# Patient Record
Sex: Male | Born: 1935 | Race: White | Hispanic: No | Marital: Married | Smoking: Former smoker
Health system: Southern US, Community
[De-identification: ages and names within clinical notes are randomized; demographics above are authoritative.]

## PROBLEM LIST (undated history)

## (undated) DIAGNOSIS — E860 Dehydration: Secondary | ICD-10-CM

## (undated) DIAGNOSIS — G629 Polyneuropathy, unspecified: Secondary | ICD-10-CM

## (undated) DIAGNOSIS — D649 Anemia, unspecified: Secondary | ICD-10-CM

## (undated) DIAGNOSIS — G709 Myoneural disorder, unspecified: Secondary | ICD-10-CM

## (undated) DIAGNOSIS — I2510 Atherosclerotic heart disease of native coronary artery without angina pectoris: Secondary | ICD-10-CM

## (undated) DIAGNOSIS — L03115 Cellulitis of right lower limb: Secondary | ICD-10-CM

## (undated) DIAGNOSIS — I10 Essential (primary) hypertension: Secondary | ICD-10-CM

## (undated) DIAGNOSIS — G8929 Other chronic pain: Secondary | ICD-10-CM

## (undated) DIAGNOSIS — F32A Depression, unspecified: Secondary | ICD-10-CM

## (undated) DIAGNOSIS — I770 Arteriovenous fistula, acquired: Secondary | ICD-10-CM

## (undated) DIAGNOSIS — I639 Cerebral infarction, unspecified: Secondary | ICD-10-CM

## (undated) DIAGNOSIS — R6251 Failure to thrive (child): Secondary | ICD-10-CM

## (undated) DIAGNOSIS — R159 Full incontinence of feces: Secondary | ICD-10-CM

## (undated) DIAGNOSIS — C801 Malignant (primary) neoplasm, unspecified: Secondary | ICD-10-CM

## (undated) DIAGNOSIS — N184 Chronic kidney disease, stage 4 (severe): Secondary | ICD-10-CM

## (undated) DIAGNOSIS — H9190 Unspecified hearing loss, unspecified ear: Secondary | ICD-10-CM

## (undated) DIAGNOSIS — F329 Major depressive disorder, single episode, unspecified: Secondary | ICD-10-CM

## (undated) DIAGNOSIS — I209 Angina pectoris, unspecified: Secondary | ICD-10-CM

## (undated) DIAGNOSIS — I499 Cardiac arrhythmia, unspecified: Secondary | ICD-10-CM

## (undated) DIAGNOSIS — R319 Hematuria, unspecified: Secondary | ICD-10-CM

## (undated) DIAGNOSIS — N400 Enlarged prostate without lower urinary tract symptoms: Secondary | ICD-10-CM

## (undated) DIAGNOSIS — R634 Abnormal weight loss: Secondary | ICD-10-CM

## (undated) DIAGNOSIS — Z8673 Personal history of transient ischemic attack (TIA), and cerebral infarction without residual deficits: Secondary | ICD-10-CM

## (undated) DIAGNOSIS — R32 Unspecified urinary incontinence: Secondary | ICD-10-CM

## (undated) DIAGNOSIS — N2581 Secondary hyperparathyroidism of renal origin: Secondary | ICD-10-CM

## (undated) DIAGNOSIS — R600 Localized edema: Secondary | ICD-10-CM

## (undated) HISTORY — DX: Failure to thrive (child): R62.51

## (undated) HISTORY — DX: Dehydration: E86.0

## (undated) HISTORY — PX: CORONARY ARTERY BYPASS GRAFT: SHX141

## (undated) HISTORY — PX: TONSILLECTOMY: SUR1361

## (undated) HISTORY — PX: OTHER SURGICAL HISTORY: SHX169

## (undated) HISTORY — DX: Chronic kidney disease, stage 4 (severe): N18.4

## (undated) HISTORY — DX: Anemia, unspecified: D64.9

## (undated) HISTORY — PX: DG AV DIALYSIS  SHUNT ACCESS EXIST*L* OR: HXRAD910

## (undated) HISTORY — PX: HERNIA REPAIR: SHX51

## (undated) HISTORY — DX: Abnormal weight loss: R63.4

## (undated) HISTORY — PX: TOTAL HIP ARTHROPLASTY: SHX124

## (undated) HISTORY — DX: Personal history of transient ischemic attack (TIA), and cerebral infarction without residual deficits: Z86.73

## (undated) HISTORY — PX: TRANSURETHRAL RESECTION OF PROSTATE: SHX73

## (undated) HISTORY — PX: EYE SURGERY: SHX253

---

## 1991-11-30 ENCOUNTER — Encounter: Payer: Self-pay | Admitting: Gastroenterology

## 1991-12-01 ENCOUNTER — Encounter: Payer: Self-pay | Admitting: Gastroenterology

## 1992-12-05 ENCOUNTER — Encounter: Payer: Self-pay | Admitting: Gastroenterology

## 1992-12-06 ENCOUNTER — Encounter: Payer: Self-pay | Admitting: Gastroenterology

## 1994-08-22 ENCOUNTER — Encounter: Payer: Self-pay | Admitting: Gastroenterology

## 1997-09-21 ENCOUNTER — Encounter: Payer: Self-pay | Admitting: Gastroenterology

## 2000-10-21 DIAGNOSIS — C801 Malignant (primary) neoplasm, unspecified: Secondary | ICD-10-CM

## 2000-10-21 HISTORY — DX: Malignant (primary) neoplasm, unspecified: C80.1

## 2000-11-04 ENCOUNTER — Encounter: Payer: Self-pay | Admitting: Gastroenterology

## 2000-11-04 ENCOUNTER — Encounter (INDEPENDENT_AMBULATORY_CARE_PROVIDER_SITE_OTHER): Payer: Self-pay

## 2000-11-04 ENCOUNTER — Other Ambulatory Visit: Admission: RE | Admit: 2000-11-04 | Discharge: 2000-11-04 | Payer: Self-pay | Admitting: Gastroenterology

## 2000-12-19 ENCOUNTER — Encounter: Admission: RE | Admit: 2000-12-19 | Discharge: 2000-12-19 | Payer: Self-pay | Admitting: Nephrology

## 2000-12-19 ENCOUNTER — Encounter: Payer: Self-pay | Admitting: Nephrology

## 2000-12-31 ENCOUNTER — Ambulatory Visit (HOSPITAL_COMMUNITY): Admission: RE | Admit: 2000-12-31 | Discharge: 2000-12-31 | Payer: Self-pay | Admitting: Nephrology

## 2000-12-31 ENCOUNTER — Encounter: Payer: Self-pay | Admitting: Nephrology

## 2002-03-22 ENCOUNTER — Encounter: Payer: Self-pay | Admitting: Gastroenterology

## 2003-11-29 ENCOUNTER — Encounter: Admission: RE | Admit: 2003-11-29 | Discharge: 2003-11-29 | Payer: Self-pay | Admitting: Surgery

## 2003-12-02 ENCOUNTER — Ambulatory Visit (HOSPITAL_COMMUNITY): Admission: RE | Admit: 2003-12-02 | Discharge: 2003-12-02 | Payer: Self-pay | Admitting: Surgery

## 2003-12-02 ENCOUNTER — Ambulatory Visit (HOSPITAL_BASED_OUTPATIENT_CLINIC_OR_DEPARTMENT_OTHER): Admission: RE | Admit: 2003-12-02 | Discharge: 2003-12-03 | Payer: Self-pay | Admitting: Surgery

## 2004-07-10 ENCOUNTER — Ambulatory Visit (HOSPITAL_COMMUNITY): Admission: RE | Admit: 2004-07-10 | Discharge: 2004-07-10 | Payer: Self-pay | Admitting: Anesthesiology

## 2004-08-23 ENCOUNTER — Ambulatory Visit: Payer: Self-pay | Admitting: Gastroenterology

## 2004-09-07 ENCOUNTER — Ambulatory Visit: Payer: Self-pay | Admitting: Gastroenterology

## 2006-07-08 ENCOUNTER — Encounter: Payer: Self-pay | Admitting: Vascular Surgery

## 2006-07-08 ENCOUNTER — Ambulatory Visit (HOSPITAL_COMMUNITY): Admission: RE | Admit: 2006-07-08 | Discharge: 2006-07-08 | Payer: Self-pay | Admitting: Nephrology

## 2008-04-04 ENCOUNTER — Ambulatory Visit: Payer: Self-pay

## 2008-04-13 ENCOUNTER — Emergency Department (HOSPITAL_COMMUNITY): Admission: EM | Admit: 2008-04-13 | Discharge: 2008-04-13 | Payer: Self-pay | Admitting: Emergency Medicine

## 2008-04-25 ENCOUNTER — Encounter: Admission: RE | Admit: 2008-04-25 | Discharge: 2008-04-25 | Payer: Self-pay | Admitting: Neurology

## 2008-05-15 ENCOUNTER — Emergency Department (HOSPITAL_BASED_OUTPATIENT_CLINIC_OR_DEPARTMENT_OTHER): Admission: EM | Admit: 2008-05-15 | Discharge: 2008-05-15 | Payer: Self-pay | Admitting: Emergency Medicine

## 2008-10-25 IMAGING — CR DG CHEST 1V PORT
1 series · 1 of 1 positions shown · non-contrast
Comparison: 11/29/2003.

CLINICAL DATA: Right chest pain following a fall 3 days ago.

PORTABLE CHEST - 1 VIEW

[view not recorded]
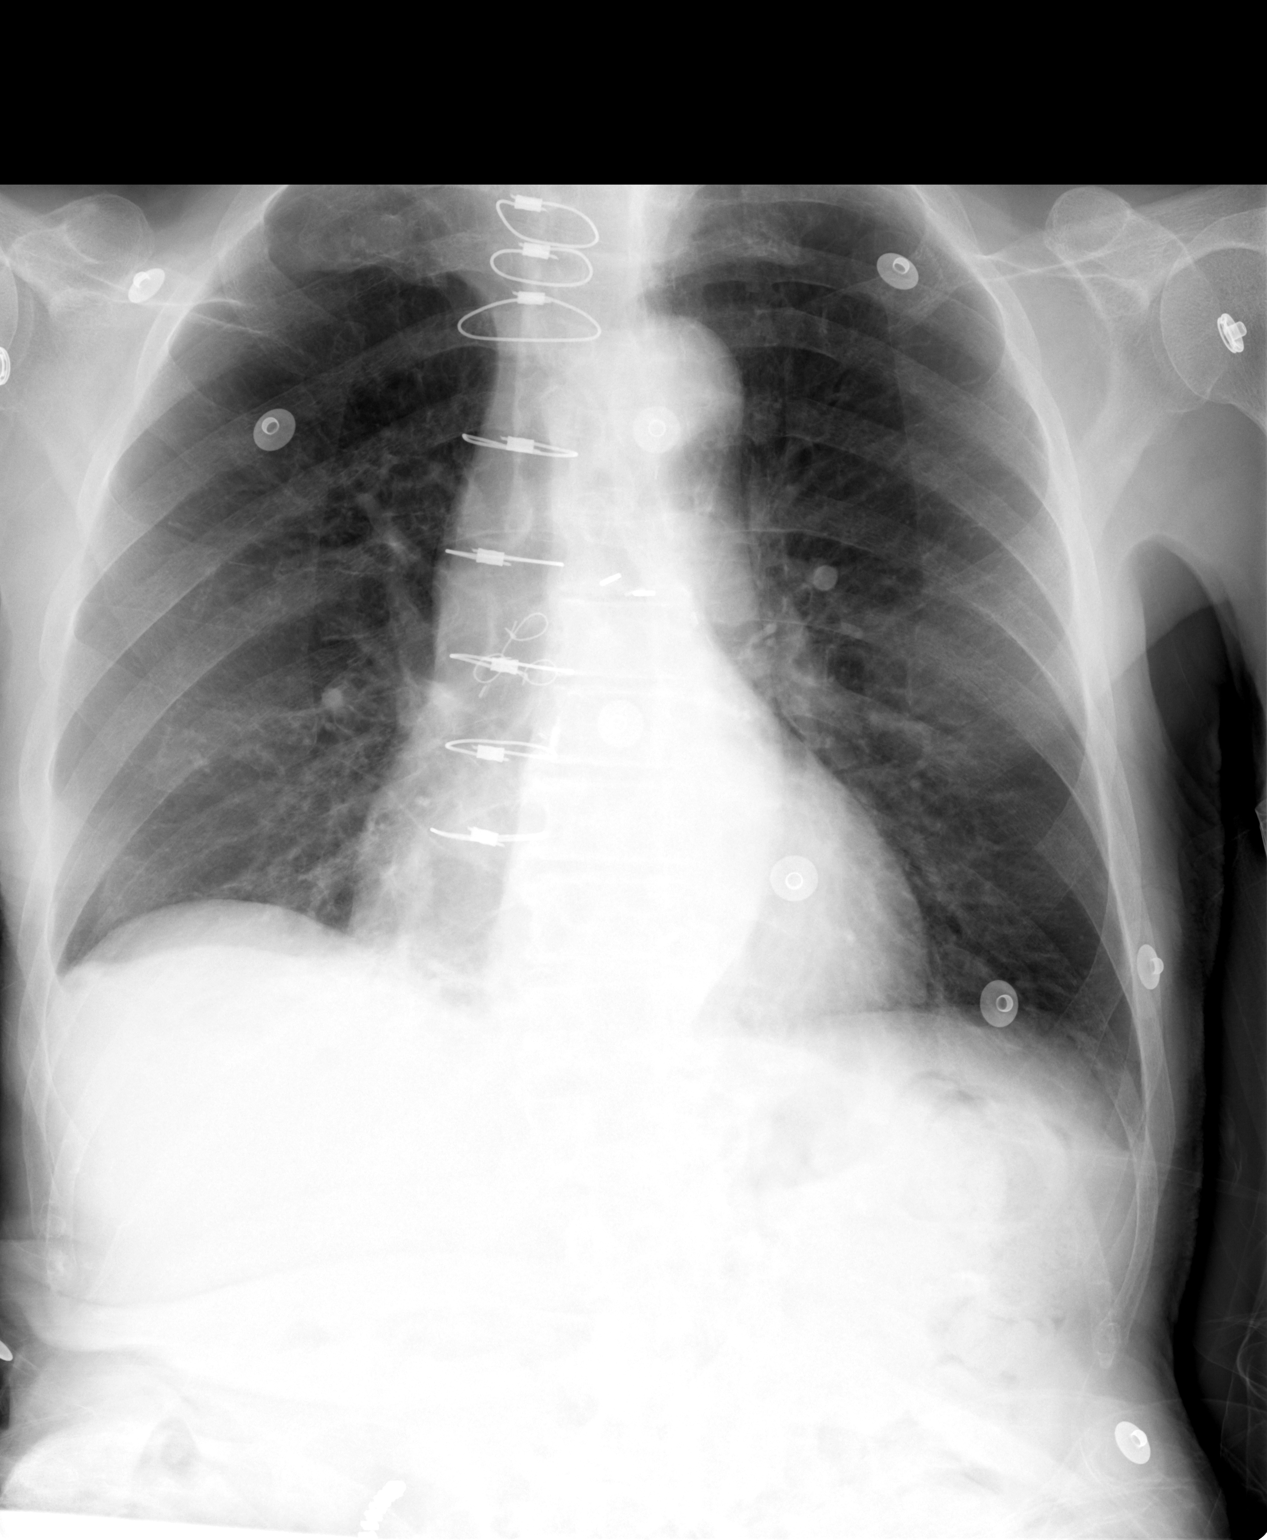

[1 of 1 positions shown; findings below may reference images not displayed]

FINDINGS: Borderline enlarged cardiac silhouette.  Tortuous aorta.
Post CABG changes.  Stable changes of COPD.  Interval small amount
of right pleural thickening or fluid.  No rib fracture or
pneumothorax seen.  Mild scoliosis.
IMPRESSION: 1.  Interval small amount of right pleural thickening or fluid.  If
there is a clinical concern for the possibility of a rib fracture,
right rib views would be recommended.
2.  Interval borderline cardiomegaly.
3.  Stable changes of COPD.

## 2009-04-14 ENCOUNTER — Encounter: Admission: RE | Admit: 2009-04-14 | Discharge: 2009-05-29 | Payer: Self-pay | Admitting: Family Medicine

## 2009-07-28 ENCOUNTER — Encounter (INDEPENDENT_AMBULATORY_CARE_PROVIDER_SITE_OTHER): Payer: Self-pay | Admitting: *Deleted

## 2009-09-06 ENCOUNTER — Ambulatory Visit: Payer: Self-pay | Admitting: Gastroenterology

## 2009-09-06 DIAGNOSIS — R159 Full incontinence of feces: Secondary | ICD-10-CM | POA: Insufficient documentation

## 2009-09-06 DIAGNOSIS — Z860100 Personal history of colon polyps, unspecified: Secondary | ICD-10-CM | POA: Insufficient documentation

## 2010-01-19 ENCOUNTER — Encounter: Payer: Self-pay | Admitting: Cardiovascular Disease

## 2010-01-19 DIAGNOSIS — I701 Atherosclerosis of renal artery: Secondary | ICD-10-CM

## 2010-01-22 ENCOUNTER — Ambulatory Visit: Payer: Self-pay

## 2010-01-22 ENCOUNTER — Encounter: Payer: Self-pay | Admitting: Cardiovascular Disease

## 2010-11-01 ENCOUNTER — Ambulatory Visit: Admit: 2010-11-01 | Payer: Self-pay | Admitting: Vascular Surgery

## 2010-11-01 ENCOUNTER — Ambulatory Visit
Admission: RE | Admit: 2010-11-01 | Discharge: 2010-11-01 | Payer: Self-pay | Source: Home / Self Care | Attending: Vascular Surgery | Admitting: Vascular Surgery

## 2010-11-10 NOTE — Procedures (Unsigned)
CEPHALIC VEIN MAPPING  INDICATION:  Preop for AV fistula placement per [facility] standing order.  HISTORY: Stage IV chronic kidney disease.  EXAM: The right cephalic vein is compressible.  Diameter measurements range from 0.2 to 0.27 cm.  The right basilic vein is compressible.  Diameter measurements range from 0.2 to 0.45 cm.  The left cephalic vein is compressible.  Diameter measurements range from 0.15 to 0.41 cm.  The left basilic vein is compressible.  Diameter measurements range from 0.2 to 0.8 cm.  See attached worksheet for all measurements.  IMPRESSION:  Patent bilateral cephalic vein and basilic veins with diameter measurements as described above.  ___________________________________________ Janetta Hora. Fields, MD  CH/MEDQ  D:  11/01/2010  T:  11/01/2010  Job:  [id number]

## 2010-11-12 LAB — BASIC METABOLIC PANEL
Calcium: 9.4 mg/dL (ref 8.4–10.5)
GFR calc Af Amer: 21 mL/min — ABNORMAL LOW (ref >60)
GFR calc non Af Amer: 18 mL/min — ABNORMAL LOW (ref >60)
Potassium: 4.2 mEq/L (ref 3.5–5.1)
Sodium: 142 mEq/L (ref 135–145)

## 2010-11-12 LAB — CBC
Hemoglobin: 12.6 g/dL — ABNORMAL LOW (ref 13.0–17.0)
MCHC: 31.9 g/dL (ref 30.0–36.0)
RDW: 13.1 % (ref 11.5–15.5)
WBC: 6.1 10*3/uL (ref 4.0–10.5)

## 2010-11-12 LAB — SURGICAL PCR SCREEN
MRSA, PCR: NEGATIVE
Staphylococcus aureus: NEGATIVE

## 2010-11-13 ENCOUNTER — Ambulatory Visit (HOSPITAL_COMMUNITY)
Admission: RE | Admit: 2010-11-13 | Discharge: 2010-11-13 | Payer: Self-pay | Source: Home / Self Care | Attending: Vascular Surgery | Admitting: Vascular Surgery

## 2010-11-14 LAB — POCT I-STAT 4, (NA,K, GLUC, HGB,HCT): Sodium: 141 mEq/L (ref 135–145)

## 2010-11-22 NOTE — Miscellaneous (Signed)
Summary: Orders Update  Clinical Lists Changes  Problems: Added new problem of ATHEROSCLEROSIS, RENAL ARTERY (ICD-440.1) Orders: Added new Test order of Renal Artery Duplex (Renal Artery Duplex) - Signed 

## 2010-11-23 NOTE — Op Note (Signed)
  Benjamin Macdonald, Benjamin Macdonald              ACCOUNT NO.:  [id number]  MEDICAL RECORD NO.:  [id number]           PATIENT TYPE:  LOCATION:                                 FACILITY:  PHYSICIAN:  Aunna Snooks E. Dorotha Hirschi, MD  DATE OF BIRTH:  [birth year]  DATE OF PROCEDURE: DATE OF DISCHARGE:                              OPERATIVE REPORT   PROCEDURE:  Left brachiocephalic arteriovenous fistula.  PREOPERATIVE DIAGNOSIS:  End-stage renal disease.  POSTOPERATIVE DIAGNOSIS:  End-stage renal disease.  ANESTHESIA:  Local with IV sedation.  SURGEON:  Janetta Hora. Daegan Arizmendi, MD  ASSISTANT:  Pecola Leisure, PA-C  OPERATIVE FINDINGS:  2.5-3 mm left cephalic vein.  OPERATIVE DETAILS:  After obtaining informed consent, the patient was taken to the operating room.  The patient was placed in supine position on the operating table.  After adequate sedation, the patient's entire left upper extremity was prepped and draped in the usual sterile fashion.  Local anesthesia was infiltrated near the antecubital crease. A transverse incision was made in this location and carried down through the subcutaneous tissues down the level of the cephalic vein.  Cephalic vein was dissected free circumferentially.  Small side branches were ligated and divided between silk ties.  The vein was of good quality approximately 2.5-3 mm in diameter.  Next, the brachial artery was dissected free in the medial portion of the incision.  The patient was then given 5000 units of intravenous heparin.  Vessel loops were used to control the artery proximally and distally.  A longitudinal opening was made in the brachial artery and the vein was sewn end of vein to side of artery using a running 7-0 Prolene suture.  Just prior to completion of anastomosis, this was forebled, backbled, and thoroughly flushed. Anastomosis was secured.  Vessel loops were released.  There was palpable thrill in the fistula immediately.  The patient had good audible  Doppler flow in the radial and ulnar arteries after opening the fistula.  This augmented slightly with compression of the fistula.  Next, hemostasis was obtained.  Subcutaneous tissues were reapproximated using running 3-0 Vicryl suture.  Skin was closed with 4-0 Vicryl subcuticular stitch.  The patient tolerated the procedure well and there were no complications.  Instrument, sponge, and needle counts were correct at the end of the case.  The patient was taken to the recovery room in stable condition.     Janetta Hora. Azeez Dunker, MD     CEF/MEDQ  D:  11/13/2010  T:  11/14/2010  Job:  [id number]  Electronically Signed by Fabienne Bruns MD on 11/23/2010 01:09:07 PM

## 2010-11-29 ENCOUNTER — Ambulatory Visit (INDEPENDENT_AMBULATORY_CARE_PROVIDER_SITE_OTHER): Payer: Medicare Other

## 2010-11-29 DIAGNOSIS — N186 End stage renal disease: Secondary | ICD-10-CM

## 2010-11-30 NOTE — Assessment & Plan Note (Signed)
OFFICE VISIT  [name], [name] DOB:  07-29-1921                                       11/29/2010 [id number]#:[id number]  HISTORY OF PRESENT ILLNESS:  Patient is an 77 year old gentleman with end-stage renal disease who had a left brachial arterial venous fistula placed on 11/13/2010.  He is doing well.  He complains of some achiness in the forearm but no numbness, tingling, or pain in his hand.  He can use his hand normally at this time.  This achiness does not keep him awake at night and is on the medial aspect of the anterior forearm.  PHYSICAL EXAMINATION:  This is a well-developed, well-nourished gentleman in no acute distress.  He has a palpable radial pulse.  He has a good thrill and bruit in the left upper arm AV fistula.  He is slightly tender to touch in the medial aspect of the anterior forearm, but this is very mild in nature.  He has good sensation and good motion and an excellent grip in the left hand with a palpable radial pulse.  ASSESSMENT/PLAN:  Status post left upper arm arteriovenous fistula with good thrill and bruit and mild discomfort in the medial forearm but no overt signs of steal.  PLAN:  The patient and family were advised that if the pain in his forearm did not improve with exercising or should become worse, to return to clinic for possible ligation of the fistula.  They were also given other signs and symptoms of steal, and if these were to become an issue, then he will return to clinic as well.  Della Goo, PA-C  Charles E. Fields, MD Electronically Signed  RR/MEDQ  D:  11/29/2010  T:  11/29/2010  Job:  [id number]

## 2011-02-14 ENCOUNTER — Ambulatory Visit (INDEPENDENT_AMBULATORY_CARE_PROVIDER_SITE_OTHER): Payer: Medicare Other | Admitting: Vascular Surgery

## 2011-02-14 ENCOUNTER — Encounter (INDEPENDENT_AMBULATORY_CARE_PROVIDER_SITE_OTHER): Payer: Medicare Other

## 2011-02-14 DIAGNOSIS — T82598A Other mechanical complication of other cardiac and vascular devices and implants, initial encounter: Secondary | ICD-10-CM

## 2011-02-14 DIAGNOSIS — N186 End stage renal disease: Secondary | ICD-10-CM

## 2011-02-15 NOTE — Assessment & Plan Note (Signed)
OFFICE VISIT  Benjamin Macdonald, Benjamin Macdonald DOB:  09-Jul-1921                                       02/14/2011 [id number]#:[id number]  The patient returns for followup today.  He previously had a left brachiocephalic AV fistula placed in January of 2012.  He returns today for further followup and evaluation of his fistula.  He denies any symptoms of numbness, tingling or aching in his hand.  He is currently not on dialysis.  REVIEW OF SYSTEMS:  He denies any shortness of breath or chest pain.  PHYSICAL EXAM:  Vital signs:  Blood pressure is 146/69 in the right arm, heart rate is 74 and regular, respirations 16.  Left upper extremity has an easily palpable thrill in the left upper arm AV fistula.  There is some tortuosity to the fistula but overall it appears well-developed.  He had a duplex ultrasound of the fistula today which shows that the diameter is fairly uniform between 60 and 90 mm.  There is one side branch in the proximal third of the fistula.  At this point the patient currently is not on dialysis.  If he requires dialysis at some point in the future I think the fistula should be usable.  If not he will return for further followup.  Otherwise he will follow up on an as-needed basis.    Janetta Hora. Jadia Capers, MD Electronically Signed  CEF/MEDQ  D:  02/14/2011  T:  02/15/2011  Job:  [id number]  cc:   Duke Salvia. Eliott Nine, M.D.

## 2011-02-22 NOTE — Procedures (Unsigned)
VASCULAR LAB EXAM  INDICATION:  HISTORY:  EXAM:  Duplex of the left upper extremity fistula shows elevated velocities at the anastomosis and a narrowed segment in the mid upper arm.  There is a large branch observed in the proximal to mid upper arm. The ulnar and radial arteries are antegrade in the forearm.  Please see worksheet for details.  IMPRESSION:  Patent left upper extremity fistula, as described above.  INDICATIONS:  Follow-up left upper arm fistula.  ___________________________________________ Janetta Hora. Fields, MD  LT/MEDQ  D:  02/14/2011  T:  02/14/2011  Job:  [id number]

## 2011-03-05 NOTE — Assessment & Plan Note (Signed)
OFFICE VISIT   KEEFER, SOULLIERE  DOB:  1921-07-11                                       11/01/2010  EAVWU#:[id number]   CHIEF COMPLAINT:  Needs hemodialysis access.   HISTORY OF PRESENT ILLNESS:  The patient is an 77 year old male referred  by Dr. Eliott Nine for evaluation and placement of a long-term hemodialysis  access.  The patient currently is not on dialysis.  Most recent serum  creatinine was 2.6 on 08/08/2010.   The patient denies any symptoms currently of volume overload or uremia  such as weight loss, nausea, vomiting, skin itching or shortness of  breath.  He is right-handed.   CHRONIC MEDICAL PROBLEMS:  Include hypertension which is followed by Dr.  Eliott Nine.   PAST SURGICAL HISTORY:  Hemorrhoidectomy, inguinal hernia repair  bilaterally, coronary artery bypass grafting, right total hip  replacement.   SOCIAL HISTORY:  He is married.  He has 3 children.  His daughter works  for [employer].  He is a former smoker, quit 35 years ago.  He does  not consume alcohol regularly.   FAMILY HISTORY:  His mother had vascular disease at age less than 96.   REVIEW OF SYSTEMS:  Full 12 point review of systems was performed with  the patient today.  VASCULAR:  He has a prior history of TIA.  GENERAL:  He is 5 feet 4 inches, 140 pounds.  HEMATOLOGIC:  History of anemia.  HEENT:  Some decrease in hearing.  All other systems are negative.   MEDICATIONS:  1. Ticlid 250 mg twice a day.  2. Nifedipine 120 mg once a day.  3. Hydrochlorothiazide 25 mg once a day.  4. Zocor 40 mg once a day.  5. Theragran once a day.  6. Methadone 5 mg three times a day.  7. Calcium, vitamin C once daily.  8. Neurontin 100 mg three times a day.  9. Lidoderm patch every 12 hours.  10.Furosemide 60 mg once a day.   ALLERGIES:  He has allergies listed to aspirin, Naprosyn and  nonsteroidals.   PHYSICAL EXAM:  Vital signs:  Blood pressure is 157/78 in the left arm,  heart rate 88 and regular.  HEENT:  Unremarkable.  Neck:  Has 2+ carotid  pulses without bruit.  Chest:  Clear to auscultation.  Cardiac:  Regular  rate and rhythm without murmur.  Abdomen:  Thin, soft, nontender,  nondistended.  Extremities:  No obvious major musculoskeletal  deformities.  Neurologic:  Shows symmetric upper extremity and lower  extremity motor strength which is 4/5.  Skin:  Has no open ulcers or  rashes.  Vascular:  Exam shows 2+ brachial and radial pulse bilaterally  in the upper extremity.   He had a vein mapping ultrasound today which shows the cephalic vein in  the left upper arm and the basilic vein in the right upper arm would be  of reasonable caliber to consider placing a fistula.  Physical exam  correlates with this.   I believe the best option for the patient at this point would be  placement of a left brachiocephalic AV fistula.  Risks, benefits,  possible complications and procedure details including but not limited  to nonmaturation of the fistula, bleeding, infection, ischemic steal  were explained to the patient and his wife and daughter today.  He  understands  and agrees to proceed.  His procedure is scheduled for  11/13/2010.     Janetta Hora. Fields, MD  Electronically Signed   CEF/MEDQ  D:  11/01/2010  T:  11/02/2010  Job:  [id number]   cc:   Duke Salvia. Eliott Nine, M.D.

## 2011-03-08 NOTE — Op Note (Signed)
NAME:  Benjamin Macdonald, Benjamin Macdonald                        ACCOUNT NO.:  [id number]   MEDICAL RECORD NO.:  [id number]                   PATIENT TYPE:  AMB   LOCATION:  [facility]                                  FACILITY:  [facility]   PHYSICIAN:  Currie Paris, M.D.           DATE OF BIRTH:  1921/07/14   DATE OF PROCEDURE:  12/02/2003  DATE OF DISCHARGE:                                 OPERATIVE REPORT   PREOPERATIVE DIAGNOSIS:  Right inguinal hernia, recurrent.   POSTOPERATIVE DIAGNOSIS:  Right inguinal hernia, recurrent.   OPERATION:  Repair of right inguinal hernia.   SURGEON:  Currie Paris, M.D.   ANESTHESIA:  MAC.   CLINICAL HISTORY:  This patient is an elderly gentleman who presented with a  recurrent right inguinal hernia, recently developed.  He had what he called  a [place] repair done in 1989.  The bulge is noticeable when he is up a  fair amount, and he has some discomfort in it and into the right testicle,  but the hernia reduced.  On exam, it seemed to be somewhat medially located  in my note, but I think that meant laterally since I had said that it as a  recurrence at the deep ring.  On exam today, it appeared to be fairly  lateral.   DESCRIPTION OF THE PROCEDURE:  The patient was seen in the holding area, and  the right inguinal area was identified by the patient and myself as the  operative site.   He was taken to the operating room and given IV sedation.  The groin area  was clipped, prepped, and draped.  1% Xylocaine plain was mixed equally with  0.5% plain Marcaine and used for local.  The area of the incision was  infiltrated, as well as below the anterior-superior iliac spine.  An  incision was made deep into the external oblique aponeurosis, which was  cleaned off.  There was a lot of scarring here from his prior surgery, and I  could see the superficial ring.  I was able to open the superficial ring  through the scar tissue, going laterally until I got into a  normal-looking  external oblique aponeurosis.  This was elevated off the underlying tissues.  A lot of the cord structures were stuck to the underlying external oblique.  It had to be dissected off those, but that went nicely.  The cord basically  just appeared to be the vessels and the vas, and I was able to identify  this.  Medially, the sutures from the old repair were noted, and I was then  able to sharply dissect the cord structures off the underlying repair, and  the repair appeared intact, and basically, he had a markedly dilated deep  ring, which was about a fingerbreadth in size, and this is where the hernia  was, and basically, it was lateral to the cord.   I freed up the  external oblique for a good distance all around so that I  could see the internal oblique and the repair and got the cord well up off  the floor.  I then took a small mesh plug and placed it into the deep ring  lateral to the cord and secured the deep ring laterally with two sutures of  Prolene, incorporating the mesh plug.  I then took a large mesh patch and  laid it on the floor to reinforce the floor again.  I was particularly  worried medially, but I was worried laterally and that we needed to get just  good reinforcement out here, so this was placed in, and I sutured it in  place with a 2-0 Prolene running, both from the tubercle going laterally  along the edge of the external oblique reflection and then medially well up  onto the deep ring so that we had good closure and good coverage.  The cord  appeared to be intact and not under any compression.   The incision was then closed, closing the external oblique with 3-0 Vicryl,  the Scarpa's with 3-0 Vicryl, and the skin with a 4-0 Monocryl subcuticular  plus Dermabond.  The patient tolerated the procedure well.  There were no  operative complications, and all counts were correct.                                               Currie Paris,  M.D.    CJS/MEDQ  D:  12/02/2003  T:  12/02/2003  Job:  [id number]   cc:   Dellis Anes. Idell Pickles, M.D.  [address]  [place]  [place] [address]  Fax: [phone]

## 2011-12-05 ENCOUNTER — Encounter: Payer: Self-pay | Admitting: Cardiology

## 2011-12-06 ENCOUNTER — Encounter: Payer: Self-pay | Admitting: Cardiology

## 2011-12-06 ENCOUNTER — Encounter (HOSPITAL_COMMUNITY): Payer: Self-pay | Admitting: Emergency Medicine

## 2011-12-06 ENCOUNTER — Inpatient Hospital Stay (HOSPITAL_COMMUNITY)
Admission: EM | Admit: 2011-12-06 | Discharge: 2011-12-07 | DRG: 683 | Disposition: A | Discharge status: Home / Self Care | Payer: Medicare Other | Attending: Internal Medicine | Admitting: Internal Medicine

## 2011-12-06 ENCOUNTER — Other Ambulatory Visit: Payer: Self-pay

## 2011-12-06 ENCOUNTER — Emergency Department (HOSPITAL_COMMUNITY): Payer: Medicare Other

## 2011-12-06 DIAGNOSIS — Z79899 Other long term (current) drug therapy: Secondary | ICD-10-CM

## 2011-12-06 DIAGNOSIS — N179 Acute kidney failure, unspecified: Principal | ICD-10-CM | POA: Diagnosis present

## 2011-12-06 DIAGNOSIS — Z880 Allergy status to penicillin: Secondary | ICD-10-CM

## 2011-12-06 DIAGNOSIS — Z886 Allergy status to analgesic agent status: Secondary | ICD-10-CM

## 2011-12-06 DIAGNOSIS — N19 Unspecified kidney failure: Secondary | ICD-10-CM

## 2011-12-06 DIAGNOSIS — Z951 Presence of aortocoronary bypass graft: Secondary | ICD-10-CM

## 2011-12-06 DIAGNOSIS — R634 Abnormal weight loss: Secondary | ICD-10-CM | POA: Diagnosis present

## 2011-12-06 DIAGNOSIS — I120 Hypertensive chronic kidney disease with stage 5 chronic kidney disease or end stage renal disease: Secondary | ICD-10-CM | POA: Diagnosis present

## 2011-12-06 DIAGNOSIS — N185 Chronic kidney disease, stage 5: Secondary | ICD-10-CM | POA: Diagnosis present

## 2011-12-06 DIAGNOSIS — N184 Chronic kidney disease, stage 4 (severe): Secondary | ICD-10-CM | POA: Diagnosis present

## 2011-12-06 DIAGNOSIS — R5381 Other malaise: Secondary | ICD-10-CM | POA: Diagnosis present

## 2011-12-06 DIAGNOSIS — R627 Adult failure to thrive: Secondary | ICD-10-CM | POA: Diagnosis present

## 2011-12-06 DIAGNOSIS — Z8546 Personal history of malignant neoplasm of prostate: Secondary | ICD-10-CM

## 2011-12-06 DIAGNOSIS — E861 Hypovolemia: Secondary | ICD-10-CM | POA: Diagnosis present

## 2011-12-06 DIAGNOSIS — G589 Mononeuropathy, unspecified: Secondary | ICD-10-CM | POA: Diagnosis present

## 2011-12-06 DIAGNOSIS — E860 Dehydration: Secondary | ICD-10-CM | POA: Diagnosis present

## 2011-12-06 DIAGNOSIS — I2510 Atherosclerotic heart disease of native coronary artery without angina pectoris: Secondary | ICD-10-CM | POA: Diagnosis present

## 2011-12-06 HISTORY — DX: Atherosclerotic heart disease of native coronary artery without angina pectoris: I25.10

## 2011-12-06 HISTORY — DX: Essential (primary) hypertension: I10

## 2011-12-06 HISTORY — DX: Malignant (primary) neoplasm, unspecified: C80.1

## 2011-12-06 HISTORY — DX: Polyneuropathy, unspecified: G62.9

## 2011-12-06 LAB — COMPREHENSIVE METABOLIC PANEL
Albumin: 3.5 g/dL (ref 3.5–5.2)
BUN: 99 mg/dL — ABNORMAL HIGH (ref 6–23)
Calcium: 9.9 mg/dL (ref 8.4–10.5)
Chloride: 91 mEq/L — ABNORMAL LOW (ref 96–112)
Creatinine, Ser: 3.87 mg/dL — ABNORMAL HIGH (ref 0.50–1.35)
Total Bilirubin: 0.4 mg/dL (ref 0.3–1.2)

## 2011-12-06 LAB — DIFFERENTIAL
Basophils Relative: 0 % (ref 0–1)
Eosinophils Absolute: 0 10*3/uL (ref 0.0–0.7)
Eosinophils Relative: 0 % (ref 0–5)
Monocytes Absolute: 0.6 10*3/uL (ref 0.1–1.0)
Monocytes Relative: 8 % (ref 3–12)
Neutro Abs: 5.5 10*3/uL (ref 1.7–7.7)

## 2011-12-06 LAB — CBC
HCT: 38.2 % — ABNORMAL LOW (ref 39.0–52.0)
Hemoglobin: 13.1 g/dL (ref 13.0–17.0)
MCH: 30.3 pg (ref 26.0–34.0)
MCHC: 34.3 g/dL (ref 30.0–36.0)
MCV: 88.4 fL (ref 78.0–100.0)

## 2011-12-06 LAB — URINALYSIS, ROUTINE W REFLEX MICROSCOPIC
Glucose, UA: NEGATIVE mg/dL
Leukocytes, UA: NEGATIVE
Protein, ur: NEGATIVE mg/dL
Urobilinogen, UA: 0.2 mg/dL (ref 0.0–1.0)

## 2011-12-06 MED ORDER — SODIUM CHLORIDE 0.9 % IV SOLN
INTRAVENOUS | Status: DC
  Administered 2011-12-06: 20:00:00 via INTRAVENOUS

## 2011-12-06 NOTE — ED Provider Notes (Signed)
History     CSN: [id number]  Arrival date & time 12/06/11  1423   First MD Initiated Contact with Patient 12/06/11 1631      Chief Complaint  Patient presents with  . Weakness    (Consider location/radiation/quality/duration/timing/severity/associated sxs/prior treatment) Patient is a 78 y.o. male presenting with weakness. The history is provided by the patient (Patient complains of weakness or 3 weeks also not eating much. His daughter states that they call Dr. Lowell Guitar today in Dr. Lowell Guitar told him to bring the patient to the emergency room to be admitted. He seems to be getting worsening renal failure.). No language interpreter was used.  Weakness Primary symptoms do not include headaches, syncope or seizures. The symptoms began more than 1 week ago. The symptoms are unchanged. The neurological symptoms are diffuse. The symptoms occurred on exertion.  Additional symptoms include weakness. Additional symptoms do not include neck stiffness or hallucinations. Medical issues do not include seizures. Workup history does not include EEG.    Past Medical History  Diagnosis Date  . Renal disorder   . Hypertension   . Cancer   . Coronary artery disease     Past Surgical History  Procedure Date  . Coronary artery bypass graft   . Dg av dialysis  shunt access exist*l* or     No family history on file.  History  Substance Use Topics  . Smoking status: Never Smoker   . Smokeless tobacco: Not on file  . Alcohol Use: No      Review of Systems  Constitutional: Positive for fatigue.  HENT: Negative for congestion, neck stiffness, sinus pressure and ear discharge.   Eyes: Negative for discharge.  Respiratory: Negative for cough.   Cardiovascular: Negative for chest pain and syncope.  Gastrointestinal: Negative for abdominal pain and diarrhea.  Genitourinary: Negative for frequency and hematuria.  Musculoskeletal: Negative for back pain.  Skin: Negative for rash.  Neurological:  Positive for weakness. Negative for seizures and headaches.  Hematological: Negative.   Psychiatric/Behavioral: Negative for hallucinations.    Allergies  Amoxicillin; Aspirin; and Nsaids  Home Medications   Current Outpatient Rx  Name Route Sig Dispense Refill  . CALCITRIOL 0.25 MCG PO CAPS Oral Take 0.25 mcg by mouth daily.    Marland Kitchen CALCIUM CARBONATE-VITAMIN D 500-200 MG-UNIT PO TABS Oral Take 1 tablet by mouth daily.    Marland Kitchen CLOPIDOGREL BISULFATE 75 MG PO TABS Oral Take 75 mg by mouth daily.    . FUROSEMIDE 20 MG PO TABS Oral Take 60 mg by mouth daily.    Marland Kitchen GABAPENTIN 100 MG PO CAPS Oral Take 100 mg by mouth 3 (three) times daily.    Marland Kitchen HYDROCHLOROTHIAZIDE 25 MG PO TABS Oral Take 25 mg by mouth daily.    Marland Kitchen METHADONE HCL 5 MG PO TABS Oral Take 5 mg by mouth 4 (four) times daily.    Carma Leaven M PLUS PO TABS Oral Take 1 tablet by mouth daily.    Marland Kitchen NIFEDIPINE 10 MG PO CAPS Oral Take 30 mg by mouth 2 (two) times daily.    Marland Kitchen SIMVASTATIN 40 MG PO TABS Oral Take 40 mg by mouth every evening.      BP 135/77  Pulse 89  Temp(Src) 97.4 F (36.3 C) (Oral)  Resp 14  SpO2 98%  Physical Exam  Constitutional: He is oriented to person, place, and time. He appears well-developed.  HENT:  Head: Normocephalic and atraumatic.  Eyes: Conjunctivae and EOM are normal. No scleral icterus.  Neck: Neck supple. No thyromegaly present.  Cardiovascular: Normal rate and regular rhythm.  Exam reveals no gallop and no friction rub.   No murmur heard. Pulmonary/Chest: No stridor. He has no wheezes. He has no rales. He exhibits no tenderness.  Abdominal: He exhibits no distension. There is no tenderness. There is no rebound.  Musculoskeletal: Normal range of motion. He exhibits no edema.  Lymphadenopathy:    He has no cervical adenopathy.  Neurological: He is oriented to person, place, and time. Coordination normal.  Skin: No rash noted. No erythema.  Psychiatric: He has a normal mood and affect. His behavior is  normal.    ED Course  Procedures (including critical care time)  Labs Reviewed  CBC - Abnormal; Notable for the following:    HCT 38.2 (*)    All other components within normal limits  COMPREHENSIVE METABOLIC PANEL - Abnormal; Notable for the following:    Potassium 3.4 (*)    Chloride 91 (*)    Glucose, Bld 118 (*)    BUN 99 (*)    Creatinine, Ser 3.87 (*)    GFR calc non Af Amer 13 (*)    GFR calc Af Amer 15 (*)    All other components within normal limits  DIFFERENTIAL  URINALYSIS, ROUTINE W REFLEX MICROSCOPIC   Dg Chest 2 View  12/06/2011  *RADIOLOGY REPORT*  Clinical Data: Weakness, weight loss  CHEST - 2 VIEW  Comparison: 11/08/2010  Findings: Cardiomediastinal silhouette is stable.  No acute infiltrate or pulmonary edema.  Status post median sternotomy again noted.  Atherosclerotic calcifications of thoracic aorta again noted.  Thoracic spine osteopenia.  IMPRESSION: No active disease.  No significant change  Original Report Authenticated By: Natasha Mead, M.D.     No diagnosis found.   Date: 12/06/2011  Rate:66  Rhythm: normal sinus rhythm  QRS Axis: normal  Intervals: normal  ST/T Wave abnormalities: nonspecific ST changes  Conduction Disutrbances:right bundle branch block  Narrative Interpretation:   Old EKG Reviewed: unchanged    MDM  Renal failure   I spoke with Dr. Lowell Guitar who wants the patient admitted he feels like the Neurontin has caused some renal failure     Benny Lennert, MD 12/06/11 [phone]

## 2011-12-06 NOTE — ED Notes (Signed)
[id number] Ready

## 2011-12-06 NOTE — ED Notes (Signed)
Pt eating meal tray with family at bedside. Awaiting orders to transfer pt to assigned bed.

## 2011-12-06 NOTE — ED Notes (Signed)
Patient transported to #25 with family and all personal belongings. Patient currently eating dinner. Admitting MD at bedside speaking with patient and family.

## 2011-12-06 NOTE — ED Notes (Signed)
Patient states feeling weak all over x 3 weeks.  Patient states he visited primary MD yesterday and he received call for abnormal results of high creatinine.  Patient with stage 4 kidney disease.  Left sided AV fistula placed in January.

## 2011-12-06 NOTE — Consult Note (Addendum)
HPI: I was asked by Dr. Estell Harpin to see Benjamin Macdonald who is a 79 y.o. 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 Eliott Nine.  Labs done on 12/21 BUN79 cr3.77  Over the past couple of weeks patient has had decreased oral intake and been wobbly.  He has severe painful neuropathy and takes Methadone and Neurontin.  He has had some confusion with the dose of neurontin taking maybe 200mg  QID.The patient has lost 12 pounds over the last couple of weeks.  Patient  Was seen by his primary [facility] MD on 2/14 and labs revealed a BUN of 111 and creatinine of 4.17mg /dl.  The patient was tod to d/c neurontin yesterday.  In the ER he is hungry and eating.  Hbg was 12.6 on 1/19, to 13.1g today  Past Medical History  Diagnosis Date  . Renal disorder   . Hypertension   . Cancer   . Coronary artery disease    Past Surgical History  Procedure Date  . Coronary artery bypass graft   . Dg av dialysis  shunt access exist*l* or    Social History:  reports that he has never smoked. He does not have any smokeless tobacco history on file. He reports that he does not drink alcohol. His drug history not on file. Allergies:  Allergies  Allergen Reactions  . Amoxicillin Swelling  . Aspirin Swelling  . Nsaids Swelling   No family history on file.  Medications: I have reviewed the patient's current medications.  Home meds include Plavix, Nifedipine 1m BID, HCTZ 25mg /day, Zocor 40mg /day, Methadone 5mg  QID, Calcium & Vit D, Neurontin 100mg  QID d/c'd, Lidoderm patch 12hr, Furosemide 60mg /day, calcitriol 0.61mcg qd, MVI  ROS: painful neuropathy Blood pressure 153/64, pulse 78, temperature 97.6 F (36.4 C), temperature source Oral, resp. rate 16, SpO2 96.00%.  General appearance: alert, cooperative and appears stated age Head: Normocephalic, without obvious abnormality, atraumatic Resp: clear to auscultation bilaterally Chest wall: no tenderness Cardio: regular rate and rhythm, S1, S2 normal, no murmur, click, rub or  gallop GI: soft, non-tender; bowel sounds normal; no masses,  no organomegaly Extremities: extremities normal, atraumatic, no cyanosis or edema, left brac-ceph AVF Skin: Skin color, texture, turgor normal. No rashes or lesions Neurologic: Grossly normal  Results for orders placed during the hospital encounter of 12/06/11 (from the past 48 hour(s))  CBC     Status: Abnormal   Collection Time   12/06/11  5:08 PM      Component Value Range Comment   WBC 8.1  4.0 - 10.5 (K/uL)    RBC 4.32  4.22 - 5.81 (MIL/uL)    Hemoglobin 13.1  13.0 - 17.0 (g/dL)    HCT 30.8 (*) 65.7 - 52.0 (%)    MCV 88.4  78.0 - 100.0 (fL)    MCH 30.3  26.0 - 34.0 (pg)    MCHC 34.3  30.0 - 36.0 (g/dL)    RDW 84.6  96.2 - 95.2 (%)    Platelets 248  150 - 400 (K/uL)   DIFFERENTIAL     Status: Normal   Collection Time   12/06/11  5:08 PM      Component Value Range Comment   Neutrophils Relative 67  43 - 77 (%)    Neutro Abs 5.5  1.7 - 7.7 (K/uL)    Lymphocytes Relative 25  12 - 46 (%)    Lymphs Abs 2.0  0.7 - 4.0 (K/uL)    Monocytes Relative 8  3 - 12 (%)  Monocytes Absolute 0.6  0.1 - 1.0 (K/uL)    Eosinophils Relative 0  0 - 5 (%)    Eosinophils Absolute 0.0  0.0 - 0.7 (K/uL)    Basophils Relative 0  0 - 1 (%)    Basophils Absolute 0.0  0.0 - 0.1 (K/uL)   COMPREHENSIVE METABOLIC PANEL     Status: Abnormal   Collection Time   12/06/11  5:08 PM      Component Value Range Comment   Sodium 136  135 - 145 (mEq/L)    Potassium 3.4 (*) 3.5 - 5.1 (mEq/L)    Chloride 91 (*) 96 - 112 (mEq/L)    CO2 31  19 - 32 (mEq/L)    Glucose, Bld 118 (*) 70 - 99 (mg/dL)    BUN 99 (*) 6 - 23 (mg/dL)    Creatinine, Ser 9.60 (*) 0.50 - 1.35 (mg/dL)    Calcium 9.9  8.4 - 10.5 (mg/dL)    Total Protein 6.3  6.0 - 8.3 (g/dL)    Albumin 3.5  3.5 - 5.2 (g/dL)    AST 25  0 - 37 (U/L)    ALT 16  0 - 53 (U/L)    Alkaline Phosphatase 72  39 - 117 (U/L)    Total Bilirubin 0.4  0.3 - 1.2 (mg/dL)    GFR calc non Af Amer 13 (*) >90  (mL/min)    GFR calc Af Amer 15 (*) >90 (mL/min)   URINALYSIS, ROUTINE W REFLEX MICROSCOPIC     Status: Normal   Collection Time   12/06/11  6:27 PM      Component Value Range Comment   Color, Urine YELLOW  YELLOW     APPearance CLEAR  CLEAR     Specific Gravity, Urine 1.012  1.005 - 1.030     pH 5.5  5.0 - 8.0     Glucose, UA NEGATIVE  NEGATIVE (mg/dL)    Hgb urine dipstick NEGATIVE  NEGATIVE     Bilirubin Urine NEGATIVE  NEGATIVE     Ketones, ur NEGATIVE  NEGATIVE (mg/dL)    Protein, ur NEGATIVE  NEGATIVE (mg/dL)    Urobilinogen, UA 0.2  0.0 - 1.0 (mg/dL)    Nitrite NEGATIVE  NEGATIVE     Leukocytes, UA NEGATIVE  NEGATIVE  MICROSCOPIC NOT DONE ON URINES WITH NEGATIVE PROTEIN, BLOOD, LEUKOCYTES, NITRITE, OR GLUCOSE <1000 mg/dL.   Dg Chest 2 View  12/06/2011  *RADIOLOGY REPORT*  Clinical Data: Weakness, weight loss  CHEST - 2 VIEW  Comparison: 11/08/2010  Findings: Cardiomediastinal silhouette is stable.  No acute infiltrate or pulmonary edema.  Status post median sternotomy again noted.  Atherosclerotic calcifications of thoracic aorta again noted.  Thoracic spine osteopenia.  IMPRESSION: No active disease.  No significant change  Original Report Authenticated By: Natasha Mead, M.D.    Assessment:  1 Stage 4/5 CKD with azotemia 2Hypovolemia  Plan: 1Hydrate  2Hold neurontin for now 3Hope to be able to rehydrate and avoid dialysis  Levi Crass C 12/06/2011, 8:00 PM

## 2011-12-06 NOTE — ED Notes (Signed)
Call pt with now answer x1

## 2011-12-06 NOTE — ED Notes (Signed)
Has been feeling  Weak x 1 week has a shunt new in [place] saw dr yesterday and his creatine is way up

## 2011-12-07 ENCOUNTER — Encounter (HOSPITAL_COMMUNITY): Payer: Self-pay | Admitting: Internal Medicine

## 2011-12-07 DIAGNOSIS — R627 Adult failure to thrive: Secondary | ICD-10-CM | POA: Diagnosis present

## 2011-12-07 DIAGNOSIS — R634 Abnormal weight loss: Secondary | ICD-10-CM | POA: Diagnosis present

## 2011-12-07 DIAGNOSIS — E860 Dehydration: Secondary | ICD-10-CM | POA: Diagnosis present

## 2011-12-07 DIAGNOSIS — N184 Chronic kidney disease, stage 4 (severe): Secondary | ICD-10-CM | POA: Diagnosis present

## 2011-12-07 LAB — RENAL FUNCTION PANEL
Albumin: 3.1 g/dL — ABNORMAL LOW (ref 3.5–5.2)
BUN: 95 mg/dL — ABNORMAL HIGH (ref 6–23)
Phosphorus: 3.1 mg/dL (ref 2.3–4.6)
Potassium: 3.1 mEq/L — ABNORMAL LOW (ref 3.5–5.1)

## 2011-12-07 LAB — CBC
MCH: 29.6 pg (ref 26.0–34.0)
MCHC: 33.2 g/dL (ref 30.0–36.0)
MCV: 89.2 fL (ref 78.0–100.0)
Platelets: 218 10*3/uL (ref 150–400)
RDW: 15.6 % — ABNORMAL HIGH (ref 11.5–15.5)
WBC: 6.9 10*3/uL (ref 4.0–10.5)

## 2011-12-07 LAB — PHOSPHORUS: Phosphorus: 3.1 mg/dL (ref 2.3–4.6)

## 2011-12-07 LAB — MAGNESIUM: Magnesium: 2.1 mg/dL (ref 1.5–2.5)

## 2011-12-07 MED ORDER — SIMVASTATIN 40 MG PO TABS
40.0000 mg | ORAL_TABLET | Freq: Every evening | ORAL | Status: DC
  Filled 2011-12-07: qty 1

## 2011-12-07 MED ORDER — GUAIFENESIN-DM 100-10 MG/5ML PO SYRP
5.0000 mL | ORAL_SOLUTION | ORAL | Status: DC | PRN
  Filled 2011-12-07: qty 5

## 2011-12-07 MED ORDER — CALCITRIOL 0.25 MCG PO CAPS
0.2500 ug | ORAL_CAPSULE | Freq: Every day | ORAL | Status: DC
  Administered 2011-12-07: 0.25 ug via ORAL
  Filled 2011-12-07: qty 1

## 2011-12-07 MED ORDER — OXYCODONE HCL 5 MG PO TABS: 5.0000 mg | ORAL_TABLET | ORAL | Status: DC | PRN

## 2011-12-07 MED ORDER — ALUM & MAG HYDROXIDE-SIMETH 200-200-20 MG/5ML PO SUSP
30.0000 mL | Freq: Four times a day (QID) | ORAL | Status: DC | PRN
  Filled 2011-12-07: qty 30

## 2011-12-07 MED ORDER — SODIUM CHLORIDE 0.9 % IJ SOLN
3.0000 mL | Freq: Two times a day (BID) | INTRAMUSCULAR | Status: DC
  Administered 2011-12-07: 3 mL via INTRAVENOUS

## 2011-12-07 MED ORDER — METHADONE HCL 5 MG PO TABS
5.0000 mg | ORAL_TABLET | Freq: Four times a day (QID) | ORAL | Status: DC
  Administered 2011-12-07: 5 mg via ORAL
  Filled 2011-12-07: qty 1

## 2011-12-07 MED ORDER — HEPARIN SODIUM (PORCINE) 5000 UNIT/ML IJ SOLN
5000.0000 [IU] | Freq: Three times a day (TID) | INTRAMUSCULAR | Status: DC
  Administered 2011-12-07 (×2): 5000 [IU] via SUBCUTANEOUS
  Filled 2011-12-07 (×5): qty 1

## 2011-12-07 MED ORDER — ACETAMINOPHEN 650 MG RE SUPP: 650.0000 mg | Freq: Four times a day (QID) | RECTAL | Status: DC | PRN

## 2011-12-07 MED ORDER — ONDANSETRON HCL 4 MG PO TABS: 4.0000 mg | ORAL_TABLET | Freq: Four times a day (QID) | ORAL | Status: DC | PRN

## 2011-12-07 MED ORDER — SODIUM CHLORIDE 0.9 % IV SOLN
INTRAVENOUS | Status: AC
  Administered 2011-12-07: 03:00:00 via INTRAVENOUS

## 2011-12-07 MED ORDER — CLOPIDOGREL BISULFATE 75 MG PO TABS
75.0000 mg | ORAL_TABLET | Freq: Every day | ORAL | Status: DC
  Administered 2011-12-07: 75 mg via ORAL
  Filled 2011-12-07: qty 1

## 2011-12-07 MED ORDER — ACETAMINOPHEN 325 MG PO TABS: 650.0000 mg | ORAL_TABLET | Freq: Four times a day (QID) | ORAL | Status: DC | PRN

## 2011-12-07 MED ORDER — ONDANSETRON HCL 4 MG/2ML IJ SOLN: 4.0000 mg | Freq: Four times a day (QID) | INTRAMUSCULAR | Status: DC | PRN

## 2011-12-07 MED ORDER — ALBUTEROL SULFATE (5 MG/ML) 0.5% IN NEBU: 2.5000 mg | INHALATION_SOLUTION | RESPIRATORY_TRACT | Status: DC | PRN

## 2011-12-07 NOTE — Discharge Summary (Signed)
Physician Discharge Summary  Patient ID: Benjamin Macdonald MRN: [id number] DOB/AGE: 12-18-1920 78 y.o.  Admit date: 12/06/2011 Discharge date: 12/07/2011  Primary Care Physician:  Daisy Floro, MD, MD   Discharge Diagnoses:    Principal Problem:  *Renal failure (ARF), acute on chronic Active Problems:  Dehydration  Weight loss  Failure to thrive in adult    Medication List  As of 12/07/2011 10:23 AM   STOP taking these medications         furosemide 20 MG tablet      gabapentin 100 MG capsule      hydrochlorothiazide 25 MG tablet         TAKE these medications         calcitRIOL 0.25 MCG capsule   Commonly known as: ROCALTROL   Take 0.25 mcg by mouth daily.      calcium-vitamin D 500-200 MG-UNIT per tablet   Commonly known as: OSCAL WITH D   Take 1 tablet by mouth daily.      clopidogrel 75 MG tablet   Commonly known as: PLAVIX   Take 75 mg by mouth daily.      methadone 5 MG tablet   Commonly known as: DOLOPHINE   Take 5 mg by mouth 4 (four) times daily.      multivitamins ther. w/minerals Tabs   Take 1 tablet by mouth daily.      NIFEdipine 10 MG capsule   Commonly known as: PROCARDIA   Take 30 mg by mouth 2 (two) times daily.      simvastatin 40 MG tablet   Commonly known as: ZOCOR   Take 40 mg by mouth every evening.             Disposition and Follow-up:  Patient will be discharged home today in stable and improved condition. Have discussed his case with his daughter Benjamin Dandy, and she will make sure that patient has an appointment to followup with his nephrologist in 10-14 days.  Consults:  nephrology Dr. Lowell Guitar   Significant Diagnostic Studies:  No results found.  Brief H and P: For complete details please refer to admission H and P, but in brief patient is a 78 y.o. male who has a past medical history of Renal disorder; Hypertension; Cancer; and Coronary artery disease.  Presented with  Worsening generalized weakness, decreases PO  intake, weight loss, unstable gait for a few weeks. No significant urinary incontinence or confusion. He had developed some confusion probably due to neurontin. Today his labs wee checked and he was found to have worsening renal function with Cr going up from baseline of 3.7 to 4.17 and BUN of 111. He was told to dc neurontin and come be evaluated in ED. He was seen earlier by Dr. Lowell Guitar who recommended fluid resuscitation in hopes to improve Cr and avoidance of HD.      Hospital Course:  Principal Problem:  *Renal failure (ARF), acute on chronic Active Problems:  Dehydration  Weight loss  Failure to thrive in adult  #1 acute on chronic kidney disease stage IV to 5: I suspect that mild dehydration had to do with his increase in creatinine above baseline. He has been given low dose of IV fluids and today his creatinine is back to his baseline of 3.7. Nephrology saw him yesterday and is hoping to avoid hemodialysis if possible. He certainly has no acute indications for dialysis at this point. We'll discontinue IV fluids at this point. He is safe to discharge home  from this perspective, and will need to followup with nephrology within 2 weeks. At this point I have elected to discontinue his Lasix and hydrochlorothiazide until further seen by nephrology given his acute on chronic kidney disease and decreased by mouth intake with failure to thrive.  #2 generalized weakness: I suspect this is secondary to a combination of his age, decreased by mouth intake and his worsening severe kidney failure. I have spoken with patient's daughter Benjamin Dandy, with whom patient lives, patient would not be willing to go to skilled nursing facility for rehabilitation, hence I do not believe that having physical therapy assess him in the hospital would be of any benefit. I will ask case management to set him up with home health physical therapy. It is possible that his Neurontin may have caused some of his weakness and hence has  been discontinued.  Time spent on Discharge: Greater than 30 minutes.  SignedChaya Jan [facility] Pager: [phone] 12/07/2011, 10:23 AM

## 2011-12-07 NOTE — H&P (Signed)
PCP:   Daisy Floro, MD, MD    Chief Complaint:   Benjamin Macdonald  HPI: Benjamin Macdonald is a 78 y.o. male   has a past medical history of Renal disorder; Hypertension; Cancer; and Coronary artery disease.   Presented with  Worsening generalized weakness, decreases PO intake, weight loss, unstable gait for a few weeks. No significant urinary incontinence or confusion. He had developed some confusion probably due to neurontin. Today his labs wee checked and he was found to have worsening renal function with Cr going up from baseline of 3.7 to 4.17 and BUN of 111. He was told to dc neurontin and come be evaluated in ED. He was seen earlier by Dr. Lowell Guitar who recommended fluid resuscitation in hopes to improve Cr and avoidance of HD. Of note his Cr in ED has already improved to 3.9. This all could be fluctuations.  He already has a fistula in place.  Review of Systems:    Pertinent positives include: weight loss,  no CP or SOB  Constitutional:  No weight loss, night sweats, Fevers, chills, fatigue.  HEENT:  No headaches, Difficulty swallowing,Tooth/dental problems,Sore throat,  No sneezing, itching, ear ache, nasal congestion, post nasal drip,  Cardio-vascular:  No chest pain, Orthopnea, PND, anasarca, dizziness, palpitations.no Bilateral lower extremity swelling  GI:  No heartburn, indigestion, abdominal pain, nausea, vomiting, diarrhea, change in bowel habits, loss of appetite, melena, blood in stool, hematoemesis Resp:  no shortness of breath at rest. No dyspnea on exertion, No excess mucus, no productive cough, No non-productive cough, No coughing up of blood.No change in color of mucus.No wheezing.No chest wall deformity  Skin:  no rash or lesions.  GU:  no dysuria, change in color of urine, no urgency or frequency. No flank pain.  Musculoskeletal:  No joint pain or swelling. No decreased range of motion. No back pain.  Psych:  No change in mood or affect. No depression or anxiety.  No memory loss.  Neuro: no localizing neurological complaints, no tingling, no weakness, no double vision, no gait abnormality, no slurred speech   Otherwise ROS are negative except for above, 10 systems were reviewed  Past Medical History: Past Medical History  Diagnosis Date  . Renal disorder   . Hypertension   . Cancer   . Coronary artery disease    Past Surgical History  Procedure Date  . Coronary artery bypass graft   . Dg av dialysis  shunt access exist*l* or      Medications: Prior to Admission medications   Medication Sig Start Date End Date Taking? Authorizing Provider  calcitRIOL (ROCALTROL) 0.25 MCG capsule Take 0.25 mcg by mouth daily.   Yes Historical Provider, MD  calcium-vitamin D (OSCAL WITH D) 500-200 MG-UNIT per tablet Take 1 tablet by mouth daily.   Yes Historical Provider, MD  clopidogrel (PLAVIX) 75 MG tablet Take 75 mg by mouth daily.   Yes Historical Provider, MD  furosemide (LASIX) 20 MG tablet Take 60 mg by mouth daily.   Yes Historical Provider, MD  gabapentin (NEURONTIN) 100 MG capsule Take 100 mg by mouth 3 (three) times daily.   Yes Historical Provider, MD  hydrochlorothiazide (HYDRODIURIL) 25 MG tablet Take 25 mg by mouth daily.   Yes Historical Provider, MD  methadone (DOLOPHINE) 5 MG tablet Take 5 mg by mouth 4 (four) times daily.   Yes Historical Provider, MD  Multiple Vitamins-Minerals (MULTIVITAMINS THER. W/MINERALS) TABS Take 1 tablet by mouth daily.   Yes Historical Provider, MD  NIFEdipine (PROCARDIA) 10 MG capsule Take 30 mg by mouth 2 (two) times daily.   Yes Historical Provider, MD  simvastatin (ZOCOR) 40 MG tablet Take 40 mg by mouth every evening.   Yes Historical Provider, MD    Allergies:   Allergies  Allergen Reactions  . Amoxicillin Swelling  . Aspirin Swelling  . Nsaids Swelling    Social History:  Ambulatory independently but recently very weak Lives at home with his wife   reports that he has quit smoking. He does not  have any smokeless tobacco history on file. He reports that he drinks alcohol.   Family History: family history includes Heart disease in his brother and mother.    Physical Exam: Patient Vitals for the past 24 hrs:  BP Temp Temp src Pulse Resp SpO2  12/06/11 2012 141/74 mmHg 97.5 F (36.4 C) Oral 80  18  97 %  12/06/11 2003 145/60 mmHg - - 73  18  100 %  12/06/11 1911 153/64 mmHg 97.6 F (36.4 C) Oral 78  16  96 %  12/06/11 1434 135/77 mmHg 97.4 F (36.3 C) Oral 89  14  98 %    1. General:  in No Acute distress 2. Psychological: Alert and Oriented 3. Head/ENT:    Dry Mucous Membranes                          Head Non traumatic, neck supple                        Poor Dentition 4. SKIN:  decreased Skin turgor,  Skin clean Dry and intact no rash 5. Heart: Regular rate and rhythm no Murmur, Rub or gallop 6. Lungs: Clear to auscultation bilaterally, no wheezes or crackles   7. Abdomen: Soft, non-tender, Non distended 8. Lower extremities: no clubbing, cyanosis, or edema 9. Neurologically Grossly intact, moving all 4 extremities equally, normal strength 5/5 in all four extremeties 10. MSK: Normal range of motion  body mass index is unknown because there is no height or weight on file.   Labs on Admission:   [facility] 12/06/11 1708  NA 136  K 3.4*  CL 91*  CO2 31  GLUCOSE 118*  BUN 99*  CREATININE 3.87*  CALCIUM 9.9  MG --  PHOS --    Basename 12/06/11 1708  AST 25  ALT 16  ALKPHOS 72  BILITOT 0.4  PROT 6.3  ALBUMIN 3.5   No results found for this basename: LIPASE:2,AMYLASE:2 in the last 72 hours  Basename 12/06/11 1708  WBC 8.1  NEUTROABS 5.5  HGB 13.1  HCT 38.2*  MCV 88.4  PLT 248   No results found for this basename: CKTOTAL:3,CKMB:3,CKMBINDEX:3,TROPONINI:3 in the last 72 hours No results found for this basename: TSH,T4TOTAL,FREET3,T3FREE,THYROIDAB in the last 72 hours No results found for this basename:  VITAMINB12:2,FOLATE:2,FERRITIN:2,TIBC:2,IRON:2,RETICCTPCT:2 in the last 72 hours No results found for this basename: HGBA1C    The CrCl is unknown because both a height and weight (above a minimum accepted value) are required for this calculation. ABG No results found for this basename: phart, pco2, po2, hco3, tco2, acidbasedef, o2sat     No results found for this basename: DDIMER     Other results:  I have pearsonaly reviewed this: ECG REPORT  Rate:66  Rhythm: partial RBBB ST&T Change: none from prior, no ischemic changes  UA no evidence of infection   Cultures: No results found for  this basename: sdes, specrequest, cult, reptstatus       Radiological Exams on Admission: Dg Chest 2 View  12/06/2011  *RADIOLOGY REPORT*  Clinical Data: Weakness, weight loss  CHEST - 2 VIEW  Comparison: 11/08/2010  Findings: Cardiomediastinal silhouette is stable.  No acute infiltrate or pulmonary edema.  Status post median sternotomy again noted.  Atherosclerotic calcifications of thoracic aorta again noted.  Thoracic spine osteopenia.  IMPRESSION: No active disease.  No significant change  Original Report Authenticated By: Natasha Mead, M.D.    Assessment/Plan  78 yo w CKD recently poor apetite failure to thrive picture with weight loss and slight worsening of his kidney function.   Present on Admission:  .Renal failure (ARF), acute on chronic - will hold off lasix and give gentle IVF, renal have seen him in ER and will follow. His creatinine is likely flactuating .Dehydration - gentle IV, check orthostatics in am .Weight loss - likely due to poor PO intake, will get nutrition consult, he has history of prostate Ca, so if no other causes found he may benefit from CA work up. Held his neurontin in case this is making him extra sleepy. May benefit from [facility] or remeron  .Failure to thrive in adult - check prealbumin   Prophylaxis: SCD   CODE STATUS:  DNR/DNI   Benjamin Macdonald 12/07/2011, 12:52 AM

## 2011-12-07 NOTE — Progress Notes (Signed)
Assessment:  1 Stage 4/5 CKD with azotemia  2Hypovolemia , s/p IVF Plan Agree with discharge and holding Neurontin for now.  S:woke up this morning and ate a sandwich at 0300  O:BP 148/76  Pulse 77  Temp(Src) 97.9 F (36.6 C) (Oral)  Resp 16  Wt 53.661 kg (118 lb 4.8 oz)  SpO2 95%  Intake/Output Summary (Last 24 hours) at 12/07/11 1100 Last data filed at 12/07/11 0900  Gross per 24 hour  Intake     50 ml  Output    575 ml  Net   -525 ml   Weight change:  WUJ:WJXBJYNW male alert and cooperative CVS:RRR Resp:clear GNF:AOZH Ext:no edema    . calcitRIOL  0.25 mcg Oral Daily  . clopidogrel  75 mg Oral Daily  . heparin  5,000 Units Subcutaneous Q8H  . methadone  5 mg Oral QID  . simvastatin  40 mg Oral QPM  . sodium chloride  3 mL Intravenous Q12H   Dg Chest 2 View  12/06/2011  *RADIOLOGY REPORT*  Clinical Data: Weakness, weight loss  CHEST - 2 VIEW  Comparison: 11/08/2010  Findings: Cardiomediastinal silhouette is stable.  No acute infiltrate or pulmonary edema.  Status post median sternotomy again noted.  Atherosclerotic calcifications of thoracic aorta again noted.  Thoracic spine osteopenia.  IMPRESSION: No active disease.  No significant change  Original Report Authenticated By: Natasha Mead, M.D.   BMET  Lab 12/07/11 0600 12/06/11 1708  NA 139 136  K 3.1* 3.4*  CL 97 91*  CO2 31 31  GLUCOSE 90 118*  BUN 95* 99*  CREATININE 3.70*3.62* 3.87*  ALB -- --  CALCIUM 9.3 9.9  PHOS 3.13.1 --   CBC  Lab 12/07/11 0600 12/06/11 1708  WBC 6.9 8.1  NEUTROABS -- 5.5  HGB 12.1* 13.1  HCT 36.5* 38.2*  MCV 89.2 88.4  PLT 218 248   Oriya Kettering C

## 2011-12-07 NOTE — Progress Notes (Signed)
Medications and follow up appt reviewed with patient and family - understanding verbalized. IV removed - catheter intact. Condition stable upon departure.

## 2011-12-07 NOTE — Progress Notes (Signed)
INITIAL ADULT NUTRITION ASSESSMENT Date: 12/07/2011   Time: 12:20 PM Reason for Assessment: Consult, unintentional weight loss  ASSESSMENT: Male 78 y.o.  Dx: Renal failure (ARF), acute on chronic  Hx:  Past Medical History  Diagnosis Date  . Renal disorder   . Hypertension   . Coronary artery disease   . Neuropathy   . Cancer 2002    prostate sp radiation    Related Meds:     . calcitRIOL  0.25 mcg Oral Daily  . clopidogrel  75 mg Oral Daily  . heparin  5,000 Units Subcutaneous Q8H  . methadone  5 mg Oral QID  . simvastatin  40 mg Oral QPM  . sodium chloride  3 mL Intravenous Q12H     Ht: 5\' 5"  (165.1 cm)   Wt: 118 lb 4.8 oz (53.661 kg)  Ideal Wt: 61.8 kg % Ideal Wt: 86.7%  Usual Wt: 132 lbs (60 kg) % Usual Wt: 89%  BMI= 19.7 kg/(m^2) WNL  Food/Nutrition Related Hx: Patient had a decreased appetite for about 2.5 weeks, resulting in about 12 lbs weight loss.  Labs:  CMP     Component Value Date/Time   NA 139 12/07/2011 0600   K 3.1* 12/07/2011 0600   CL 97 12/07/2011 0600   CO2 31 12/07/2011 0600   GLUCOSE 90 12/07/2011 0600   BUN 95* 12/07/2011 0600   CREATININE 3.70* 12/07/2011 0600   CREATININE 3.62* 12/07/2011 0600   CALCIUM 9.3 12/07/2011 0600   PROT 6.3 12/06/2011 1708   ALBUMIN 3.1* 12/07/2011 0600   AST 25 12/06/2011 1708   ALT 16 12/06/2011 1708   ALKPHOS 72 12/06/2011 1708   BILITOT 0.4 12/06/2011 1708   GFRNONAA 13* 12/07/2011 0600   GFRNONAA 14* 12/07/2011 0600   GFRAA 15* 12/07/2011 0600   GFRAA 16* 12/07/2011 0600      Intake/Output Summary (Last 24 hours) at 12/07/11 1224 Last data filed at 12/07/11 0900  Gross per 24 hour  Intake     50 ml  Output    575 ml  Net   -525 ml     Diet Order: Renal  Supplements/Tube Feeding: none  IVF:    sodium chloride Last Rate: 75 mL/hr at 12/06/11 2015  sodium chloride Last Rate: 50 mL/hr at 12/07/11 0249    Estimated Nutritional Needs:   Kcal: 1750-1950 Protein: 35-45 gm Fluid: 1.8 - 2  L  Patient had weight loss associated with decreased appetite, patients appetite has returned and is eating well today ( woke in the middle of the night and requested food).Weight loss was 9% in 2.5 weeks, significant. Patient observed with lunch tray, appetite appeared normal. Family present, states patient will be going home today. Patient likely has some level of malnutrition related to weight loss and decreased appetite, unable to accurately diagnose at this time.   NUTRITION DIAGNOSIS: -Inadequate oral intake (NI-2.1).  Status: Resolved  RELATED TO: worsening renal function  AS EVIDENCE BY: weight loss, 12 lbs in 2.5 weeks  MONITORING/EVALUATION(Goals): Goal: PO intake will continue to be >75% of meals while admitted Monitor: PO intake, weight, discharge needs  EDUCATION NEEDS: -No education needs identified at this time  INTERVENTION: 1. Encouraged family to use Nepro supplements if po intake decreases again and supplements are initiated.  2. RD will follow.    Dietitian [phone]  DOCUMENTATION CODES Per approved criteria  -Not Applicable    Clarene Duke MARIE 12/07/2011, 12:20 PM

## 2011-12-09 NOTE — Progress Notes (Signed)
   CARE MANAGEMENT NOTE 12/09/2011  Patient:  Benjamin Macdonald, Benjamin Macdonald   Account Number:  [id number]  Date Initiated:  12/09/2011  Documentation initiated by:  [facility]  Subjective/Objective Assessment:   ARF, weight loss, dehydration     Action/Plan:   lives at home with wife, Benjamin Macdonald   Anticipated DC Date:  12/07/2011   Anticipated DC Plan:  HOME W HOME HEALTH SERVICES      DC Planning Services  CM consult      [facility] Choice  HOME HEALTH   Choice offered to / List presented to:  C-3 Spouse        HH arranged  HH-2 PT      [facility] agency  [facility]   Status of service:  Completed, signed off Medicare Important Message given?   (If response is "NO", the following Medicare IM given date fields will be blank) Date Medicare IM given:   Date Additional Medicare IM given:    Discharge Disposition:  HOME W HOME HEALTH SERVICES  Per UR Regulation:    Comments:  12/09/2011 1000 Contacted pt and gave permission to speak to wife, Benjamin Macdonald. Offered choice for [facility] PT. States she does not have preference. She decided with Benjamin Macdonald. Provided wife with number for Benjamin Macdonald. Contacted [facility] for [facility] PT. Gave orders, facesheet and d/c summary to rep. Benjamin Donning RN CCM Case Mgmt phone [phone]

## 2011-12-10 ENCOUNTER — Emergency Department (HOSPITAL_COMMUNITY): Payer: Medicare Other

## 2011-12-10 ENCOUNTER — Encounter (HOSPITAL_COMMUNITY): Payer: Self-pay | Admitting: Neurology

## 2011-12-10 ENCOUNTER — Other Ambulatory Visit: Payer: Self-pay

## 2011-12-10 ENCOUNTER — Emergency Department (HOSPITAL_COMMUNITY)
Admission: EM | Admit: 2011-12-10 | Discharge: 2011-12-10 | Disposition: A | Discharge status: Home / Self Care | Payer: Medicare Other | Attending: Emergency Medicine | Admitting: Emergency Medicine

## 2011-12-10 DIAGNOSIS — R42 Dizziness and giddiness: Secondary | ICD-10-CM | POA: Insufficient documentation

## 2011-12-10 DIAGNOSIS — I129 Hypertensive chronic kidney disease with stage 1 through stage 4 chronic kidney disease, or unspecified chronic kidney disease: Secondary | ICD-10-CM | POA: Insufficient documentation

## 2011-12-10 DIAGNOSIS — R5381 Other malaise: Secondary | ICD-10-CM | POA: Insufficient documentation

## 2011-12-10 DIAGNOSIS — C61 Malignant neoplasm of prostate: Secondary | ICD-10-CM | POA: Insufficient documentation

## 2011-12-10 DIAGNOSIS — R531 Weakness: Secondary | ICD-10-CM

## 2011-12-10 DIAGNOSIS — M25579 Pain in unspecified ankle and joints of unspecified foot: Secondary | ICD-10-CM | POA: Insufficient documentation

## 2011-12-10 DIAGNOSIS — Z951 Presence of aortocoronary bypass graft: Secondary | ICD-10-CM | POA: Insufficient documentation

## 2011-12-10 DIAGNOSIS — Z79899 Other long term (current) drug therapy: Secondary | ICD-10-CM | POA: Insufficient documentation

## 2011-12-10 DIAGNOSIS — G8929 Other chronic pain: Secondary | ICD-10-CM | POA: Insufficient documentation

## 2011-12-10 DIAGNOSIS — I2510 Atherosclerotic heart disease of native coronary artery without angina pectoris: Secondary | ICD-10-CM | POA: Insufficient documentation

## 2011-12-10 DIAGNOSIS — N189 Chronic kidney disease, unspecified: Secondary | ICD-10-CM | POA: Insufficient documentation

## 2011-12-10 DIAGNOSIS — G609 Hereditary and idiopathic neuropathy, unspecified: Secondary | ICD-10-CM | POA: Insufficient documentation

## 2011-12-10 LAB — COMPREHENSIVE METABOLIC PANEL
ALT: 15 U/L (ref 0–53)
AST: 23 U/L (ref 0–37)
Albumin: 3.1 g/dL — ABNORMAL LOW (ref 3.5–5.2)
CO2: 27 mEq/L (ref 19–32)
Calcium: 9.5 mg/dL (ref 8.4–10.5)
Chloride: 102 mEq/L (ref 96–112)
Creatinine, Ser: 2.8 mg/dL — ABNORMAL HIGH (ref 0.50–1.35)
GFR calc non Af Amer: 19 mL/min — ABNORMAL LOW (ref >90)
Sodium: 138 mEq/L (ref 135–145)
Total Bilirubin: 0.4 mg/dL (ref 0.3–1.2)

## 2011-12-10 LAB — DIFFERENTIAL
Basophils Absolute: 0 10*3/uL (ref 0.0–0.1)
Basophils Relative: 0 % (ref 0–1)
Lymphocytes Relative: 14 % (ref 12–46)
Neutro Abs: 3.8 10*3/uL (ref 1.7–7.7)
Neutrophils Relative %: 77 % (ref 43–77)

## 2011-12-10 LAB — CBC
MCHC: 33.3 g/dL (ref 30.0–36.0)
Platelets: 155 10*3/uL (ref 150–400)
RDW: 15.6 % — ABNORMAL HIGH (ref 11.5–15.5)
WBC: 5 10*3/uL (ref 4.0–10.5)

## 2011-12-10 LAB — URINALYSIS, ROUTINE W REFLEX MICROSCOPIC
Bilirubin Urine: NEGATIVE
Glucose, UA: NEGATIVE mg/dL
Hgb urine dipstick: NEGATIVE
Specific Gravity, Urine: 1.01 (ref 1.005–1.030)
Urobilinogen, UA: 0.2 mg/dL (ref 0.0–1.0)

## 2011-12-10 LAB — GLUCOSE, CAPILLARY: Glucose-Capillary: 102 mg/dL — ABNORMAL HIGH (ref 70–99)

## 2011-12-10 LAB — TROPONIN I: Troponin I: <0.3 ng/mL (ref <0.30)

## 2011-12-10 NOTE — ED Notes (Signed)
Pt did not do well walking. Did not even make it out of the room. Pts knees kept giving out.

## 2011-12-10 NOTE — Discharge Instructions (Signed)
Begin physical therapy as previously planned.  Follow up with your primary doctor for further evaluation and management.   Home Safety and Preventing Falls Falls are a leading cause of injury and while they affect all age groups, falls have greater short-term and long-term impact on older age groups. However, falls should not be a part of life or aging. It is possible for individuals and their families to use preventive measures to significantly decrease the likelihood that anyone, especially an older adult, will fall. There are many simple measures which can make your home safer with respect to preventing falls. The following actions can help reduce falls among all members of your family and are especially important as you age, when your balance, lower limb strength, coordination, and eyesight may be declining. The use of preventive measures will help to reduce you and your family's risk of falls and serious medical consequences. OUTDOORS  Repair cracks and edges of walkways and driveways.   Remove high doorway thresholds and trim shrubbery on the main path into your home.   Ensure there is good outside lighting at main entrances and along main walkways.   Clear walkways of tools, rocks, debris, and clutter.   Check that handrails are not broken and are securely fastened. Both sides of steps should have handrails.   In the garage, be attentive to and clean up grease or oil spills on the cement. This can make the surface extremely slippery.   In winter, have leaves, snow, and ice cleared regularly.   Use sand or salt on walkways during winter months.  BATHROOM  Install grab bars by the toilet and in the tub and shower.   Use non-skid mats or decals in the tub or shower.   If unable to easily stand unsupported while showering, place a plastic non slip stool in the shower to sit on when needed.   Install night lights.   Keep floors dry and clean up all water on the floor immediately.     Remove soap buildup in tub or shower on a regular basis.   Secure bath mats with non-slip, double-sided rug tape.   Remove tripping hazards from the floors.  BEDROOMS  Install night lights.   Do not use oversized bedding.   Make sure a bedside light is easy to reach.   Keep a telephone by your bedside.   Make sure that you can get in and out of your bed easily.   Have a firm chair, with side arms, to use for getting dressed.   Remove clutter from around closets.   Store clothing, bed coverings, and other household items where you can reach them comfortably.   Remove tripping hazards from the floor.  LIVING AREAS AND STAIRWAYS  Turn on lights to avoid having to walk through dark areas.   Keep lighting uniform in each room. Place brighter lightbulbs in darker areas, including stairways.   Replace lightbulbs that burn out in stairways immediately.   Arrange furniture to provide for clear pathways.   Keep furniture in the same place.   Eliminate or tape down electrical cables in high traffic areas.   Place handrails on both sides of stairways. Use handrails when going up or down stairs.   Most falls occur on the top or bottom 3 steps.   Fix any loose handrails. Make sure handrails on both sides of the stairways are as long as the stairs.   Remove all walkway obstacles.   Coil or tape electrical  cords off to the side of walking areas and out of the way. If using many extension cords, have an electrician put in a new wall outlet to reduce or eliminate them.   Make sure spills are cleaned up quickly and allow time for drying before walking on freshly cleaned floors.   Firmly attach carpet with non-skid or two-sided tape.   Keep frequently used items within easy reach.   Remove tripping hazards such as throw rugs and clutter in walkways. Never leave objects on stairs.   Get rid of throw rugs elsewhere if possible.   Eliminate uneven floor surfaces.   Make sure  couches and chairs are easy to get into and out of.   Check carpeting to make sure it is firmly attached along stairs.   Make repairs to worn or loose carpet promptly.   Select a carpet pattern that does not visually hide the edge of steps.   Avoid placing throw rugs or scatter rugs at the top or bottom of stairways, or properly secure with carpet tape to prevent slippage.   Have an electrician put in a light switch at the top and bottom of the stairs.   Get light switches that glow.   Avoid the following practices: hurrying, inattention, obscured vision, carrying large loads, and wearing slip-on shoes.   Be aware of all pets.  KITCHEN  Place items that are used frequently, such as dishes and food, within easy reach.   Keep handles on pots and pans toward the center of the stove. Use back burners when possible.   Make sure spills are cleaned up quickly and allow time for drying.   Avoid walking on wet floors.   Avoid hot utensils and knives.   Position shelves so they are not too high or low.   Place commonly used objects within easy reach.   If necessary, use a sturdy step stool with a grab bar when reaching.   Make sure electrical cables are out of the way.   Do not use floor polish or wax that makes floors slippery.  OTHER HOME FALL PREVENTION STRATEGIES  Wear low heel or rubber sole shoes that are supportive and fit well.   Wear closed toe shoes.   Know and watch for side effects of medications. Have your caregiver or pharmacist look at all your medicines, even over-the-counter medicines. Some medicines can make you sleepy or dizzy.   Exercise regularly. Exercise makes you stronger and improves your balance and coordination.   Limit use of alcohol.   Use eyeglasses if necessary and keep them clean. Have your vision checked every year.   Organize your household in a manner that minimizes the need to walk distances when hurried, or go up and down stairs  unnecessarily. For example, have a phone placed on at least each floor of your home. If possible, have a phone beside each sitting or lying area where you spend the most time at home. Keep emergency numbers posted at all phones.   Use non-skid floor wax.   When using a ladder, make sure:   The base is firm.   All ladder feet are on level ground.   The ladder is angled against the wall properly.   When climbing a ladder, face the ladder and hold the ladder rungs firmly.   If reaching, always keep your hips and body weight centered between the rails.   When using a stepladder, make sure it is fully opened and both spreaders are  firmly locked.   Do not climb a closed stepladder.   Avoid climbing beyond the second step from the top of a stepladder and the 4th rung from the top of an extension ladder.   Learn and use mobility aids as needed.   Change positions slowly. Arise slowly from sitting and lying positions. Sit on the edge of your bed before getting to your feet.   If you have a history of falls, ask someone to add color or contrast paint or tape to grab bars and handrails in your home.   If you have a history of falls, ask someone to place contrasting color strips on first and last steps.   Install an electrical emergency response system if you need one, and know how to use it.   If you have a medical or other condition that causes you to have limited physical strength, it is important that you reach out to family and friends for occasional help.  FOR CHILDREN:  If young children are in the home, use safety gates. At the top of stairs use screw-mounted gates; use pressure-mounted gates for the bottom of the stairs and doorways between rooms.   Young children should be taught to descend stairs on their stomachs, feet first, and later using the handrail.   Keep drawers fully closed to prevent them from being climbed on or pulled out entirely.   Move chairs, cribs, beds and  other furniture away from windows.   Consider installing window guards on windows ground floor and up, unless they are emergency fire exits. Make sure they have easy release mechanisms.   Consider installing special locks that only allow the window to be opened to a certain height.   Never rely on window screens to prevent falls.   Never leave babies alone on changing tables, beds or sofas. Use a changing table that has a restraining strap.   When a child can pull to a standing position, the crib mattress should be adjusted to its lowest position. There should be at least 26 inches between the top rails of the crib drop side and the mattress. Toys, bumper pads, and other objects that can be used as steps to climb out should be removed from the crib.   On bunk beds never allow a child under age 84 to sleep on the top bunk. For older children, if the upper bunk is not against a wall, use guard rails on both sides. No matter how old a child is, keep the guard rails in place on the top bunk since children roll during sleep. Do not permit horseplay on bunks.   Grass and soil surfaces beneath backyard playground equipment should be replaced with hardwood chips, shredded wood mulch, sand, pea gravel, rubber, crushed stone, or another safer material at depths of at least 9 to 12 inches.   When riding bikes or using skates, skateboards, skis, or snowboards, require children to wear helmets. Look for those that have stickers stating that they meet or exceed safety standards.   Vertical posts or pickets in deck, balcony, and stairway railings should be no more than 3 1/2 inches apart if a young baby will have access to the area. The space between horizontal rails or bars, and between the floor and the first horizontal rail or bar, should be no more than 3 1/2 inches.  Document Released: 09/27/2002 Document Revised: 06/19/2011 Document Reviewed: 07/27/2009 [facility] Patient Information 2012 [place],  [place].     Weakness, Generalized Without  Cause Your caregiver has seen you today because you are having problems with feelings of weakness. Weakness has many different causes, some of which are common and others are very rare. The causes of weakness are so numerous they could not all be listed on this page. The exam and other tests done today do not reveal a specific cause for the weakness that is an immediate danger or something that is treatable. Your caregiver has checked you for the most common causes of weakness and feels it is safe for you to go home and be observed. HOME CARE INSTRUCTIONS   For the time being, obtain more rest if needed.   Eat a well balanced diet.   Try to get at least some exercise every day in spite of how difficult it may seem at times. In the case of the elderly, exercise is especially important. As we grow older, there is a loss of muscle mass. Generally, there is also a loss of, or decrease in, activity that comes naturally with the aging process. Exercise and increased activities are the only tools we have to combat this natural process.   The results of some tests ordered today may not be available right away. You will be contacted with those results when they become available.   It is important to follow through with your physician as per instructions that you may have received today.  SEEK MEDICAL CARE IF:   You have any new concerns which you do not feel were dealt with today.   The weakness seems to be getting progressively worse.   You develop new or unusual aches or pains.  SEEK IMMEDIATE MEDICAL CARE IF:   You are unable to tend to your usual daily activities such as simply getting dressed, feeding yourself, or keeping up with your personal hygiene.   You develop inability to walk stairs or perform your usual daily activities.   You develop shortness of breath, chest pain, have difficulty moving parts of your body, or develop new problems for  which you have not talked to your caregiver.   You experience difficulty speaking or swallowing.   You develop loss of control of bladder or bowels that was not present before.  Document Released: 10/07/2005 Document Revised: 06/19/2011 Document Reviewed: 03/19/2007 [facility] Patient Information 2012 [place], [place].

## 2011-12-10 NOTE — ED Provider Notes (Signed)
Results for orders placed during the hospital encounter of 12/10/11  CBC      Component Value Range   WBC 5.0  4.0 - 10.5 (K/uL)   RBC 3.69 (*) 4.22 - 5.81 (MIL/uL)   Hemoglobin 11.1 (*) 13.0 - 17.0 (g/dL)   HCT 04.5 (*) 40.9 - 52.0 (%)   MCV 90.2  78.0 - 100.0 (fL)   MCH 30.1  26.0 - 34.0 (pg)   MCHC 33.3  30.0 - 36.0 (g/dL)   RDW 81.1 (*) 91.4 - 15.5 (%)   Platelets 155  150 - 400 (K/uL)  DIFFERENTIAL      Component Value Range   Neutrophils Relative 77  43 - 77 (%)   Neutro Abs 3.8  1.7 - 7.7 (K/uL)   Lymphocytes Relative 14  12 - 46 (%)   Lymphs Abs 0.7  0.7 - 4.0 (K/uL)   Monocytes Relative 9  3 - 12 (%)   Monocytes Absolute 0.4  0.1 - 1.0 (K/uL)   Eosinophils Relative 0  0 - 5 (%)   Eosinophils Absolute 0.0  0.0 - 0.7 (K/uL)   Basophils Relative 0  0 - 1 (%)   Basophils Absolute 0.0  0.0 - 0.1 (K/uL)  COMPREHENSIVE METABOLIC PANEL      Component Value Range   Sodium 138  135 - 145 (mEq/L)   Potassium 3.3 (*) 3.5 - 5.1 (mEq/L)   Chloride 102  96 - 112 (mEq/L)   CO2 27  19 - 32 (mEq/L)   Glucose, Bld 94  70 - 99 (mg/dL)   BUN 59 (*) 6 - 23 (mg/dL)   Creatinine, Ser 7.82 (*) 0.50 - 1.35 (mg/dL)   Calcium 9.5  8.4 - 95.6 (mg/dL)   Total Protein 5.7 (*) 6.0 - 8.3 (g/dL)   Albumin 3.1 (*) 3.5 - 5.2 (g/dL)   AST 23  0 - 37 (U/L)   ALT 15  0 - 53 (U/L)   Alkaline Phosphatase 58  39 - 117 (U/L)   Total Bilirubin 0.4  0.3 - 1.2 (mg/dL)   GFR calc non Af Amer 19 (*) >90 (mL/min)   GFR calc Af Amer 22 (*) >90 (mL/min)  URINALYSIS, ROUTINE W REFLEX MICROSCOPIC      Component Value Range   Color, Urine YELLOW  YELLOW    APPearance CLEAR  CLEAR    Specific Gravity, Urine 1.010  1.005 - 1.030    pH 7.5  5.0 - 8.0    Glucose, UA NEGATIVE  NEGATIVE (mg/dL)   Hgb urine dipstick NEGATIVE  NEGATIVE    Bilirubin Urine NEGATIVE  NEGATIVE    Ketones, ur NEGATIVE  NEGATIVE (mg/dL)   Protein, ur NEGATIVE  NEGATIVE (mg/dL)   Urobilinogen, UA 0.2  0.0 - 1.0 (mg/dL)   Nitrite NEGATIVE   NEGATIVE    Leukocytes, UA TRACE (*) NEGATIVE   TROPONIN I      Component Value Range   Troponin I <0.30  <0.30 (ng/mL)  GLUCOSE, CAPILLARY      Component Value Range   Glucose-Capillary 102 (*) 70 - 99 (mg/dL)   Comment 1 Notify RN    URINE MICROSCOPIC-ADD ON      Component Value Range   WBC, UA 3-6  <3 (WBC/hpf)   Bacteria, UA RARE  RARE    DG CHEST 2 VIEW   Final Result:    IMPRESSION: COPD/emphysema. Pleuroparenchymal scarring at the bases. No acute cardiopulmonary disease. Stable examination.    MR BRAIN WO CONTRAST  Final Result:    Final result by Rad Results In Interface (12/10/11 15:50:45)    Narrative:   *RADIOLOGY REPORT*  Clinical Data: High blood pressure. Renal disorder. Dizziness with gait instability. Rule out cerebellar infarct.  MRI HEAD WITHOUT CONTRAST  Technique: Multiplanar, multiecho pulse sequences of the brain and surrounding structures were obtained according to standard protocol without intravenous contrast.  Comparison: 05/15/2008 head CT. No comparison brain MR.  Findings: No acute infarct.  No intracranial hemorrhage.  Global atrophy without hydrocephalus.  Small vessel disease type changes. Remote posterior left frontal lobe infarct.  No intracranial mass lesion detected on this unenhanced exam.  Small right vertebral artery. Major intracranial vascular structures are patent.  Minimal to mild paranasal sinus mucosal thickening.  IMPRESSION: No acute infarct. Please see above.      DG CHEST 2 VIEW    (Results Pending)     MDM:  Care transferred from Dr. Fayrene Fearing.  78 y.o. M with wks of generalized wkness and recent vague dizziness.  Mild Lt sided abnl finger-to-nose/ataxia.  H/o same with acute on chronic renal failure, but no sig creat increase today.  MRI head ordered/pending for r/o cerebellar ischemia with plan for neuro consult vs d/c home if MRI neg.  MRI without acute path.  Will d/c with plan for PT and PCP  f/u.  Clinical impression: 1. Weakness   2. Chronic kidney disease      Particia Lather, MD 12/11/11 [id number]

## 2011-12-10 NOTE — ED Notes (Signed)
Patient transported to MRI 

## 2011-12-10 NOTE — ED Notes (Signed)
CBG 102 notified RN

## 2011-12-10 NOTE — ED Provider Notes (Signed)
History     CSN: [id number]  Arrival date & time 12/10/11  8295   First MD Initiated Contact with Patient 12/10/11 [phone]      Chief Complaint  Patient presents with  . Weakness  . Abnormal Lab  . Knee Pain    (Consider location/radiation/quality/duration/timing/severity/associated sxs/prior treatment) Patient is a 78 y.o. male presenting with weakness and knee pain. The history is provided by the patient and the spouse (Daughter).  Weakness The primary symptoms include dizziness and loss of sensation (chronic neuropathy.). Primary symptoms do not include headaches, syncope, loss of consciousness, altered mental status, focal weakness, speech change, fever, nausea or vomiting. The symptoms began more than 1 week ago (Several weeks.). The symptoms are unchanged. The neurological symptoms are diffuse. Context: Unknown.  Dizziness also occurs with weakness (Generalized.). Dizziness does not occur with nausea or vomiting.  Additional symptoms include weakness (Generalized.) and leg pain (chronic right ankle pain.). Additional symptoms do not include loss of balance or dysphoric mood. Medical issues also include diabetes and hypertension. Procedure history comments: Was recently admitted from 12/05/14 with weakness and acute on chronic renal failure. He was hydrated with return to baseline kidney function..  Knee Pain Associated symptoms include weakness (Generalized.). Pertinent negatives include no abdominal pain, arthralgias, chest pain, chills, congestion, fever, headaches, nausea, neck pain, rash, sore throat or vomiting.    Past Medical History  Diagnosis Date  . Renal disorder   . Hypertension   . Coronary artery disease   . Neuropathy   . Cancer 2002    prostate sp radiation    Past Surgical History  Procedure Date  . Coronary artery bypass graft   . Dg av dialysis  shunt access exist*l* or     Family History  Problem Relation Age of Onset  . Heart disease Mother   .  Heart disease Brother     History  Substance Use Topics  . Smoking status: Former Games developer  . Smokeless tobacco: Not on file  . Alcohol Use: Yes     occasional      Review of Systems  Constitutional: Negative for fever and chills.  HENT: Negative for congestion, sore throat, rhinorrhea and neck pain.   Eyes: Negative for pain, redness and visual disturbance.  Respiratory: Negative for chest tightness and shortness of breath.   Cardiovascular: Negative for chest pain, leg swelling and syncope.  Gastrointestinal: Negative for nausea, vomiting, abdominal pain, diarrhea and constipation.  Genitourinary: Negative for dysuria and difficulty urinating.  Musculoskeletal: Negative for back pain and arthralgias.  Skin: Negative for rash and wound.  Neurological: Positive for dizziness and weakness (Generalized.). Negative for speech change, focal weakness, loss of consciousness, headaches and loss of balance.  Psychiatric/Behavioral: Negative for confusion, dysphoric mood and altered mental status.  All other systems reviewed and are negative.    Allergies  Aspirin; Ciprofloxacin; Doxycycline; Nsaids; Amoxicillin; and Cephalexin  Home Medications   Current Outpatient Rx  Name Route Sig Dispense Refill  . CALCITRIOL 0.25 MCG PO CAPS Oral Take 0.25 mcg by mouth daily.    Marland Kitchen CALCIUM CARBONATE-VITAMIN D 500-200 MG-UNIT PO TABS Oral Take 1 tablet by mouth daily.    Marland Kitchen CLOPIDOGREL BISULFATE 75 MG PO TABS Oral Take 75 mg by mouth daily.    Marland Kitchen LIDOCAINE 5 % EX PTCH Transdermal Place 1 patch onto the skin every 12 (twelve) hours. Remove & Discard patch within 12 hours or as directed by MD    . METHADONE HCL 5 MG PO  TABS Oral Take 5 mg by mouth 4 (four) times daily.    Carma Leaven M PLUS PO TABS Oral Take 1 tablet by mouth daily.    Marland Kitchen NIFEDIPINE 10 MG PO CAPS Oral Take 30 mg by mouth 2 (two) times daily.    Marland Kitchen SIMVASTATIN 40 MG PO TABS Oral Take 40 mg by mouth every evening.      BP 155/78  Pulse 82   Temp(Src) 97.8 F (36.6 C) (Oral)  Resp 22  SpO2 99%  Physical Exam  Nursing note and vitals reviewed. Constitutional: He is oriented to person, place, and time. He appears well-developed. No distress.       Elderly, thin  HENT:  Head: Normocephalic and atraumatic.  Right Ear: External ear normal.  Left Ear: External ear normal.  Mouth/Throat: Oropharynx is clear and moist.  Eyes: Pupils are equal, round, and reactive to light.  Neck: Normal range of motion. Neck supple.  Cardiovascular: Normal rate, regular rhythm, normal heart sounds and intact distal pulses.  Exam reveals no gallop and no friction rub.   No murmur heard. Pulmonary/Chest: Effort normal and breath sounds normal. No respiratory distress. He has no wheezes. He has no rales.  Abdominal: Soft. There is no tenderness. There is no rebound and no guarding.  Musculoskeletal: Normal range of motion. He exhibits no edema and no tenderness.  Lymphadenopathy:    He has no cervical adenopathy.  Neurological: He is alert and oriented to person, place, and time.       5/5 strength in all extremities.   Skin: Skin is warm and dry. No rash noted. No erythema.  Psychiatric: He has a normal mood and affect. His behavior is normal.    ED Course  Procedures (including critical care time)  Results for orders placed during the hospital encounter of 12/10/11  CBC      Component Value Range   WBC 5.0  4.0 - 10.5 (K/uL)   RBC 3.69 (*) 4.22 - 5.81 (MIL/uL)   Hemoglobin 11.1 (*) 13.0 - 17.0 (g/dL)   HCT 11.9 (*) 14.7 - 52.0 (%)   MCV 90.2  78.0 - 100.0 (fL)   MCH 30.1  26.0 - 34.0 (pg)   MCHC 33.3  30.0 - 36.0 (g/dL)   RDW 82.9 (*) 56.2 - 15.5 (%)   Platelets 155  150 - 400 (K/uL)  DIFFERENTIAL      Component Value Range   Neutrophils Relative 77  43 - 77 (%)   Neutro Abs 3.8  1.7 - 7.7 (K/uL)   Lymphocytes Relative 14  12 - 46 (%)   Lymphs Abs 0.7  0.7 - 4.0 (K/uL)   Monocytes Relative 9  3 - 12 (%)   Monocytes Absolute  0.4  0.1 - 1.0 (K/uL)   Eosinophils Relative 0  0 - 5 (%)   Eosinophils Absolute 0.0  0.0 - 0.7 (K/uL)   Basophils Relative 0  0 - 1 (%)   Basophils Absolute 0.0  0.0 - 0.1 (K/uL)  COMPREHENSIVE METABOLIC PANEL      Component Value Range   Sodium 138  135 - 145 (mEq/L)   Potassium 3.3 (*) 3.5 - 5.1 (mEq/L)   Chloride 102  96 - 112 (mEq/L)   CO2 27  19 - 32 (mEq/L)   Glucose, Bld 94  70 - 99 (mg/dL)   BUN 59 (*) 6 - 23 (mg/dL)   Creatinine, Ser 1.30 (*) 0.50 - 1.35 (mg/dL)   Calcium 9.5  8.4 -  10.5 (mg/dL)   Total Protein 5.7 (*) 6.0 - 8.3 (g/dL)   Albumin 3.1 (*) 3.5 - 5.2 (g/dL)   AST 23  0 - 37 (U/L)   ALT 15  0 - 53 (U/L)   Alkaline Phosphatase 58  39 - 117 (U/L)   Total Bilirubin 0.4  0.3 - 1.2 (mg/dL)   GFR calc non Af Amer 19 (*) >90 (mL/min)   GFR calc Af Amer 22 (*) >90 (mL/min)  URINALYSIS, ROUTINE W REFLEX MICROSCOPIC      Component Value Range   Color, Urine YELLOW  YELLOW    APPearance CLEAR  CLEAR    Specific Gravity, Urine 1.010  1.005 - 1.030    pH 7.5  5.0 - 8.0    Glucose, UA NEGATIVE  NEGATIVE (mg/dL)   Hgb urine dipstick NEGATIVE  NEGATIVE    Bilirubin Urine NEGATIVE  NEGATIVE    Ketones, ur NEGATIVE  NEGATIVE (mg/dL)   Protein, ur NEGATIVE  NEGATIVE (mg/dL)   Urobilinogen, UA 0.2  0.0 - 1.0 (mg/dL)   Nitrite NEGATIVE  NEGATIVE    Leukocytes, UA TRACE (*) NEGATIVE   TROPONIN I      Component Value Range   Troponin I <0.30  <0.30 (ng/mL)  GLUCOSE, CAPILLARY      Component Value Range   Glucose-Capillary 102 (*) 70 - 99 (mg/dL)   Comment 1 Notify RN    URINE MICROSCOPIC-ADD ON      Component Value Range   WBC, UA 3-6  <3 (WBC/hpf)   Bacteria, UA RARE  RARE     Dg Chest 2 View  12/10/2011  *RADIOLOGY REPORT*  Clinical Data: Prior CABG.  History of hypertension.  Generalized weakness.  Lower extremity pain.  CHEST - 2 VIEW 12/10/2011:  Comparison: Two-view chest x-ray 12/06/2011, 11/08/2010 [facility] and 11/29/2003 [facility].   Findings: Prior sternotomy for CABG.  Cardiac silhouette upper normal in size for the AP technique, unchanged.  Thoracic aorta tortuous and atherosclerotic, unchanged.  Hilar and mediastinal contours otherwise unremarkable.  Hyperinflation and emphysematous changes throughout both lungs, unchanged.  Blunting of the costophrenic angles, unchanged, consistent with chronic pleural scarring.  No new pulmonary parenchymal abnormalities.  No pleural effusions.  Degenerative changes involving the thoracic spine.  IMPRESSION: COPD/emphysema.  Pleuroparenchymal scarring at the bases.  No acute cardiopulmonary disease.  Stable examination.  Original Report Authenticated By: Arnell Sieving, M.D.   Mr Brain Wo Contrast  12/10/2011  *RADIOLOGY REPORT*  Clinical Data: High blood pressure.  Renal disorder.  Dizziness with gait instability.  Rule out cerebellar infarct.  MRI HEAD WITHOUT CONTRAST  Technique:  Multiplanar, multiecho pulse sequences of the brain and surrounding structures were obtained according to standard protocol without intravenous contrast.  Comparison: 05/15/2008 head CT.  No comparison brain MR.  Findings: No acute infarct.  No intracranial hemorrhage.  Global atrophy without hydrocephalus.  Small vessel disease type changes.  Remote posterior left frontal lobe infarct.  No intracranial mass lesion detected on this unenhanced exam.  Small right vertebral artery.  Major intracranial vascular structures are patent.  Minimal to mild paranasal sinus mucosal thickening.  IMPRESSION: No acute infarct.  Please see above.  Original Report Authenticated By: Fuller Canada, M.D.     Date: 12/10/2011  Rate: 93  Rhythm: normal sinus rhythm  QRS Axis: normal  Intervals: normal  ST/T Wave abnormalities: normal  Conduction Disutrbances:first-degree A-V block   Narrative Interpretation:   Old EKG Reviewed: unchanged  1. Weakness   2. Chronic kidney disease       MDM  71:18 AM 78 year old male  with history of chronic kidney disease with baseline creatinine around 3.7, CAD status post coronary bypass, diabetes presenting with generalized weakness for several weeks. He has also had a 12 pound weight loss in the past 2 months. Patient was recently admitted 2/15-2/16 with these symptoms. At this time he is found to have acute on chronic renal failure. He was hydrated with subsequent return to baseline kidney function. He states he continues to feel weak but denies any other symptoms. He's had no recent fever, cough, chest pain, dyspnea, abdominal pain, nausea, vomiting or diarrhea. His vitals are stable he appears well. His neurological exam is nonfocal. At this point is unclear as to the etiology of his weakness. Of note he did have a recent TSH which was also normal. We'll recheck a CBC, CMP, urinalysis, troponin, EKG and reassess.  3:22 PM initial labs are without significant abnormality. Patient's renal function is actually improved from baseline. On reevaluation patient continues to state that he doesn't feel right. He continues to complain of lightheadedness and feeling off balance when he walks around. On reexamination the patient was noted to have some ataxia of the left upper extremity with cerebellar testing, along with slowing of rapid alternating movements. We'll check MRI brain to rule out cerebellar infarct. Care transferred to Dr. Lendell Caprice pending MRI results.       Sheran Luz, MD 12/10/11 [phone]

## 2011-12-10 NOTE — ED Notes (Signed)
Per ems- pt comes from home reporting feeling weak, right knee pain x several weeks. Was seen here last week for high creatinine levels, discharged home. Pt has continued to feel weak. Alert and oriented. CBG 112. No neuro deficts noted. 122/76, 80, 96%. Denying any falls. NAD

## 2011-12-11 ENCOUNTER — Encounter: Payer: Self-pay | Admitting: Cardiology

## 2011-12-11 LAB — URINE CULTURE: Culture  Setup Time: 201302191134

## 2011-12-11 NOTE — ED Provider Notes (Signed)
I was available for the completion of this patient's case, though I did not evaluate the patient.  Please see the original note for full visit information.    Elyse Jarvis, MD 12/11/11 2200

## 2011-12-13 NOTE — ED Provider Notes (Signed)
I saw and evaluated the patient, reviewed the resident's note and EKG reading and I agree with the findings and plan.  78yo M, c/o gradual onset and persistence of constant generalized weakness for the past several weeks.  Was just d/c from hospital for same symptoms.  VSS, labs have not worsened from pt's baseline, EKG unchanged from previous, CXR without focal infection.  Pt continued to feel "weak" and "not feeling right" and "off balance feeling" while attempting to walk.  Will obtain MRI brain to r/o CVA esp cerebellar.  Sign out to Drs. Jeraldine Loots and [employer].  If MRI brain negative for acute process, family would like to take pt home, as they already have Home Health services set up from his recent d/c from the hospital.   Laray Anger, DO 12/13/11 2134

## 2011-12-19 ENCOUNTER — Encounter: Payer: Self-pay | Admitting: Cardiology

## 2012-01-03 ENCOUNTER — Ambulatory Visit (INDEPENDENT_AMBULATORY_CARE_PROVIDER_SITE_OTHER): Payer: Medicare Other | Admitting: Cardiology

## 2012-01-03 ENCOUNTER — Encounter: Payer: Self-pay | Admitting: Cardiology

## 2012-01-03 VITALS — BP 182/68 | HR 96 | Ht 64.0 in | Wt 134.6 lb

## 2012-01-03 DIAGNOSIS — I2510 Atherosclerotic heart disease of native coronary artery without angina pectoris: Secondary | ICD-10-CM

## 2012-01-03 DIAGNOSIS — I499 Cardiac arrhythmia, unspecified: Secondary | ICD-10-CM

## 2012-01-03 DIAGNOSIS — I498 Other specified cardiac arrhythmias: Secondary | ICD-10-CM

## 2012-01-03 DIAGNOSIS — Z951 Presence of aortocoronary bypass graft: Secondary | ICD-10-CM

## 2012-01-03 DIAGNOSIS — I4891 Unspecified atrial fibrillation: Secondary | ICD-10-CM | POA: Insufficient documentation

## 2012-01-03 DIAGNOSIS — N189 Chronic kidney disease, unspecified: Secondary | ICD-10-CM

## 2012-01-03 NOTE — Progress Notes (Signed)
Benjamin Macdonald Date of Birth: Sep 23, 1921 Medical Record #[id number]  History of Present Illness: Benjamin Macdonald is seen today at the request of Dr. Eliott Nine for evaluation of an irregular pulse. He is now 78 years old. He has a history of coronary disease had coronary bypass surgery in 1992. He has not had cardiac followup in a number of years. He has chronic kidney disease stage IV. He was recently admitted to the hospital in February after an inappropriate overdose of Neurontin. When seen yesterday by Dr. Eliott Nine his pulse was irregular. ECG was obtained and showed a variable heart rate between 70 and 100 beats per minute. Patient was completely asymptomatic. He denies any palpitations, dizziness, or syncope. He's had no increase in shortness of breath or chest pain. He states he generally feels well. He does have a history of TIAs in the past in 1995 and 1996. He denies any recent TIA symptoms. He has no known bleeding disorder. He has no history of falls.  Current Outpatient Prescriptions on File Prior to Visit  Medication Sig Dispense Refill  . calcitRIOL (ROCALTROL) 0.25 MCG capsule Take 0.25 mcg by mouth daily.      . calcium-vitamin D (OSCAL WITH D) 500-200 MG-UNIT per tablet Take 1 tablet by mouth daily.      . clopidogrel (PLAVIX) 75 MG tablet Take 75 mg by mouth daily.      . furosemide (LASIX) 80 MG tablet Take 80 mg by mouth 2 (two) times daily. 1 tablet in the morning and 1/2 tablet in the afternoon      . gabapentin (NEURONTIN) 100 MG capsule Take 100 mg by mouth 4 (four) times daily. 2 capsules every 6 hours      . lidocaine (LIDODERM) 5 % Place 1 patch onto the skin every 12 (twelve) hours. Remove & Discard patch within 12 hours or as directed by MD      . methadone (DOLOPHINE) 5 MG tablet Take 5 mg by mouth 4 (four) times daily.      . Multiple Vitamins-Minerals (MULTIVITAMINS THER. W/MINERALS) TABS Take 1 tablet by mouth daily.      Marland Kitchen NIFEdipine (PROCARDIA) 10 MG capsule Take 30 mg by  mouth 2 (two) times daily.      . simvastatin (ZOCOR) 40 MG tablet Take 40 mg by mouth every evening.        Allergies  Allergen Reactions  . Aspirin Swelling  . Ciprofloxacin Diarrhea  . Doxycycline Diarrhea and Nausea And Vomiting  . Nsaids Swelling  . Amoxicillin Rash  . Cephalexin Rash    Past Medical History  Diagnosis Date  . CKD (chronic kidney disease), stage IV   . Hypertension   . Coronary artery disease     Cabg 1992  . Neuropathy   . Cancer 2002    prostate sp radiation  . Dehydration   . Weight loss   . Failure to thrive     In Adult  . Anemia   . History of TIAs     Past Surgical History  Procedure Date  . Coronary artery bypass graft     1992  . Dg av dialysis  shunt access exist*l* or     History  Smoking status  . Former Smoker  Smokeless tobacco  . Not on file    History  Alcohol Use  . Yes    occasional    Family History  Problem Relation Age of Onset  . Heart disease Mother   .  Heart disease Brother     Review of Systems: The review of systems is positive for increased lower extremity edema.  His Lasix dose was just increased yesterday. All other systems were reviewed and are negative.  Physical Exam: BP 182/68  Pulse 96  Ht 5\' 4"  (1.626 m)  Wt 134 lb 9.6 oz (61.054 kg)  BMI 23.10 kg/m2 He is an elderly, thin, white male in no acute distress. He is normocephalic, atraumatic. Pupils are equal round and reactive to light and accommodation. Extraocular movements are clear. Oropharynx is clear. Neck is supple without JVD, adenopathy, thyromegaly, or bruits. Lungs are clear. Cardiac exam reveals an irregular rhythm with a rate of 95 beats per minute. There is a soft grade 1/6 systolic murmur at the left sternal border. Abdomen is soft and nontender without masses or bruits. Extremities reveal 2-3+ edema below the knees bilaterally. His skin is dry and scaly. He is alert and oriented x3. Cranial nerves II through XII are  intact. LABORATORY DATA: ECG today demonstrates normal sinus rhythm with a wandering atrial pacemaker and first degree AV block. He has an incomplete right bundle branch block and nonspecific ST-T wave abnormality. I also reviewed ECGs from yesterday and multiple EKG recordings from February 15 through the 20th. These all demonstrate similar arrhythmia with predominance of sinus rhythm with a wandering atrial pacemaker. There were a couple of tracings in the hospital which showed an atrial tachycardia versus an atypical flutter with variable AV block. His heart rate is controlled on these tracings. TSH was normal in February.   Assessment / Plan:

## 2012-01-03 NOTE — Patient Instructions (Signed)
We will schedule you for an Echocardiogram.  Continue your current medication.  I will see you again in 2 months.  Call if you experience symptoms of racing heart, dizzyness, or blacking out.

## 2012-01-03 NOTE — Assessment & Plan Note (Signed)
His predominant rhythm appears to be sinus with a wandering atrial pacemaker versus frequent PACs. During his hospital stay he appeared to have some ectopic atrial tachycardia. This does not appear to be atrial fibrillation. He is asymptomatic. His heart rate is acceptable. At this point we will await his lab work from yesterday. I've added no additional medication. Will obtain an echocardiogram. I would not recommend anticoagulation at this time. We'll plan a followup again in 2 months.

## 2012-01-13 ENCOUNTER — Other Ambulatory Visit: Payer: Self-pay

## 2012-01-13 ENCOUNTER — Ambulatory Visit (HOSPITAL_COMMUNITY): Payer: Medicare Other | Attending: Cardiology

## 2012-01-13 DIAGNOSIS — I359 Nonrheumatic aortic valve disorder, unspecified: Secondary | ICD-10-CM

## 2012-01-13 DIAGNOSIS — Z951 Presence of aortocoronary bypass graft: Secondary | ICD-10-CM

## 2012-01-13 DIAGNOSIS — I2510 Atherosclerotic heart disease of native coronary artery without angina pectoris: Secondary | ICD-10-CM

## 2012-01-13 DIAGNOSIS — Z8673 Personal history of transient ischemic attack (TIA), and cerebral infarction without residual deficits: Secondary | ICD-10-CM | POA: Insufficient documentation

## 2012-01-13 DIAGNOSIS — N186 End stage renal disease: Secondary | ICD-10-CM | POA: Insufficient documentation

## 2012-01-13 DIAGNOSIS — I059 Rheumatic mitral valve disease, unspecified: Secondary | ICD-10-CM

## 2012-01-13 DIAGNOSIS — I369 Nonrheumatic tricuspid valve disorder, unspecified: Secondary | ICD-10-CM

## 2012-01-13 DIAGNOSIS — Z992 Dependence on renal dialysis: Secondary | ICD-10-CM | POA: Insufficient documentation

## 2012-01-13 DIAGNOSIS — I499 Cardiac arrhythmia, unspecified: Secondary | ICD-10-CM | POA: Insufficient documentation

## 2012-01-13 DIAGNOSIS — N189 Chronic kidney disease, unspecified: Secondary | ICD-10-CM

## 2012-01-13 DIAGNOSIS — I498 Other specified cardiac arrhythmias: Secondary | ICD-10-CM

## 2012-01-31 ENCOUNTER — Encounter: Payer: Self-pay | Admitting: Cardiology

## 2012-03-04 ENCOUNTER — Ambulatory Visit (INDEPENDENT_AMBULATORY_CARE_PROVIDER_SITE_OTHER): Payer: Medicare Other | Admitting: Cardiology

## 2012-03-04 ENCOUNTER — Encounter: Payer: Self-pay | Admitting: Cardiology

## 2012-03-04 VITALS — BP 134/66 | HR 80 | Ht 64.0 in | Wt 125.6 lb

## 2012-03-04 DIAGNOSIS — I2510 Atherosclerotic heart disease of native coronary artery without angina pectoris: Secondary | ICD-10-CM

## 2012-03-04 DIAGNOSIS — I4719 Other supraventricular tachycardia: Secondary | ICD-10-CM

## 2012-03-04 DIAGNOSIS — I498 Other specified cardiac arrhythmias: Secondary | ICD-10-CM

## 2012-03-04 DIAGNOSIS — I499 Cardiac arrhythmia, unspecified: Secondary | ICD-10-CM

## 2012-03-04 DIAGNOSIS — I471 Supraventricular tachycardia: Secondary | ICD-10-CM

## 2012-03-04 NOTE — Patient Instructions (Signed)
Continue your current therapy  I will see you again in 1 year.   

## 2012-03-05 NOTE — Progress Notes (Signed)
Benjamin Macdonald Date of Birth: 1921/09/22 Medical Record #[id number]  History of Present Illness: Benjamin Macdonald is seen today for followup. He states he is feeling great. He states he works around his yard like crazy. He denies any dizziness, palpitations, syncope, chest pain, or shortness of breath. He feels that his energy is good.  Current Outpatient Prescriptions on File Prior to Visit  Medication Sig Dispense Refill  . calcitRIOL (ROCALTROL) 0.25 MCG capsule Take 0.25 mcg by mouth daily.      . calcium-vitamin D (OSCAL WITH D) 500-200 MG-UNIT per tablet Take 1 tablet by mouth daily.      . clopidogrel (PLAVIX) 75 MG tablet Take 75 mg by mouth daily.      . furosemide (LASIX) 80 MG tablet Take 120 mg by mouth as directed.       . gabapentin (NEURONTIN) 100 MG capsule Take 100 mg by mouth as directed.       . lidocaine (LIDODERM) 5 % Place 1 patch onto the skin every 12 (twelve) hours. Remove & Discard patch within 12 hours or as directed by MD      . methadone (DOLOPHINE) 5 MG tablet Take 5 mg by mouth 4 (four) times daily.      . Multiple Vitamins-Minerals (MULTIVITAMINS THER. W/MINERALS) TABS Take 1 tablet by mouth daily.      Marland Kitchen NIFEdipine (PROCARDIA) 10 MG capsule Take 30 mg by mouth 2 (two) times daily.      . simvastatin (ZOCOR) 40 MG tablet Take 40 mg by mouth every evening.        Allergies  Allergen Reactions  . Aspirin Swelling  . Ciprofloxacin Diarrhea  . Doxycycline Diarrhea and Nausea And Vomiting  . Nsaids Swelling  . Amoxicillin Rash  . Cephalexin Rash    Past Medical History  Diagnosis Date  . CKD (chronic kidney disease), stage IV   . Hypertension   . Coronary artery disease     Cabg 1992  . Neuropathy   . Cancer 2002    prostate sp radiation  . Dehydration   . Weight loss   . Failure to thrive     In Adult  . Anemia   . History of TIAs     Past Surgical History  Procedure Date  . Coronary artery bypass graft     1992  . Dg av dialysis  shunt  access exist*l* or     History  Smoking status  . Former Smoker  Smokeless tobacco  . Not on file    History  Alcohol Use  . Yes    occasional    Family History  Problem Relation Age of Onset  . Heart disease Mother   . Heart disease Brother     Review of Systems: As noted in history of present illness. All other systems were reviewed and are negative.  Physical Exam: BP 134/66  Pulse 80  Ht 5\' 4"  (1.626 m)  Wt 125 lb 9.6 oz (56.972 kg)  BMI 21.56 kg/m2 He is an elderly, thin, white male in no acute distress. He is normocephalic, atraumatic. Pupils are equal round and reactive to light and accommodation. Extraocular movements are clear. Oropharynx is clear. Neck is supple without JVD, adenopathy, thyromegaly, or bruits. Lungs are clear. Cardiac exam reveals an irregular rhythm with a rate of 95 beats per minute. There is a soft grade 1/6 systolic murmur at the left sternal border. Abdomen is soft and nontender without masses or bruits. Extremities  reveal 1+ edema below the knees bilaterally. His skin is dry and scaly. He is alert and oriented x3. Cranial nerves II through XII are intact. LABORATORY DATA:   Assessment / Plan:

## 2012-03-05 NOTE — Assessment & Plan Note (Signed)
History of wandering atrial pacemaker and PACs. History of some ectopic atrial tachycardia. He is completely asymptomatic. Echocardiogram showed mild LVH with normal systolic function. He had mild biatrial enlargement. There was moderate tricuspid insufficiency. At this point no additional treatment is needed as he is asymptomatic. I will plan a followup again in one year unless he has increased problems.

## 2012-04-07 ENCOUNTER — Encounter: Payer: Self-pay | Admitting: Cardiology

## 2012-06-08 ENCOUNTER — Other Ambulatory Visit: Payer: Self-pay | Admitting: Dermatology

## 2013-01-01 ENCOUNTER — Encounter: Payer: Self-pay | Admitting: Cardiology

## 2013-02-15 ENCOUNTER — Encounter: Payer: Self-pay | Admitting: Cardiology

## 2013-02-15 ENCOUNTER — Telehealth: Payer: Self-pay | Admitting: Cardiology

## 2013-03-24 ENCOUNTER — Other Ambulatory Visit: Payer: Self-pay | Admitting: Dermatology

## 2013-03-24 DIAGNOSIS — N6320 Unspecified lump in the left breast, unspecified quadrant: Secondary | ICD-10-CM

## 2013-04-07 ENCOUNTER — Ambulatory Visit
Admission: RE | Admit: 2013-04-07 | Discharge: 2013-04-07 | Disposition: A | Discharge status: Home / Self Care | Payer: Medicare Other | Source: Ambulatory Visit | Attending: Dermatology | Admitting: Dermatology

## 2013-04-07 DIAGNOSIS — N6320 Unspecified lump in the left breast, unspecified quadrant: Secondary | ICD-10-CM

## 2013-09-22 ENCOUNTER — Other Ambulatory Visit: Payer: Self-pay | Admitting: Dermatology

## 2014-02-02 ENCOUNTER — Ambulatory Visit: Payer: Medicare Other | Admitting: Cardiology

## 2014-03-07 ENCOUNTER — Ambulatory Visit (INDEPENDENT_AMBULATORY_CARE_PROVIDER_SITE_OTHER): Payer: Medicare Other | Admitting: Cardiology

## 2014-03-07 ENCOUNTER — Encounter: Payer: Self-pay | Admitting: Cardiology

## 2014-03-07 VITALS — BP 165/71 | HR 96 | Ht 64.0 in | Wt 115.1 lb

## 2014-03-07 DIAGNOSIS — I499 Cardiac arrhythmia, unspecified: Secondary | ICD-10-CM

## 2014-03-07 DIAGNOSIS — N184 Chronic kidney disease, stage 4 (severe): Secondary | ICD-10-CM

## 2014-03-07 DIAGNOSIS — I498 Other specified cardiac arrhythmias: Secondary | ICD-10-CM

## 2014-03-07 DIAGNOSIS — I2510 Atherosclerotic heart disease of native coronary artery without angina pectoris: Secondary | ICD-10-CM

## 2014-03-07 NOTE — Patient Instructions (Signed)
Continue your current therapy  I will see you in 1 year.   

## 2014-03-08 DIAGNOSIS — I2510 Atherosclerotic heart disease of native coronary artery without angina pectoris: Secondary | ICD-10-CM | POA: Insufficient documentation

## 2014-03-08 NOTE — Progress Notes (Signed)
Benjamin Macdonald Date of Birth: 10-Mar-1921 Medical Record #[id number]  History of Present Illness: Benjamin Macdonald is seen today for 2 year follow up. He states he is feeling great. He still enjoys doing his yard work. He denies any dizziness, palpitations, syncope, chest pain, or shortness of breath. He feels that his energy is good. No new health problems in the last 2 years.  Current Outpatient Prescriptions on File Prior to Visit  Medication Sig Dispense Refill  . calcitRIOL (ROCALTROL) 0.25 MCG capsule Take 0.25 mcg by mouth daily.      . calcium-vitamin D (OSCAL WITH D) 500-200 MG-UNIT per tablet Take 1 tablet by mouth daily.      . clopidogrel (PLAVIX) 75 MG tablet Take 75 mg by mouth daily.      . furosemide (LASIX) 80 MG tablet Take 120 mg by mouth as directed.       . gabapentin (NEURONTIN) 100 MG capsule Take 100 mg by mouth as directed.       . hydrochlorothiazide (HYDRODIURIL) 25 MG tablet Take 25 mg by mouth daily.      Marland Kitchen lidocaine (LIDODERM) 5 % Place 1 patch onto the skin every 12 (twelve) hours. Remove & Discard patch within 12 hours or as directed by MD      . methadone (DOLOPHINE) 5 MG tablet Take 5 mg by mouth 4 (four) times daily.      . Multiple Vitamins-Minerals (MULTIVITAMINS THER. W/MINERALS) TABS Take 1 tablet by mouth daily.      Marland Kitchen NIFEdipine (PROCARDIA) 10 MG capsule Take 30 mg by mouth 2 (two) times daily.      . simvastatin (ZOCOR) 40 MG tablet Take 40 mg by mouth every evening.       No current facility-administered medications on file prior to visit.    Allergies  Allergen Reactions  . Aspirin Swelling  . Ciprofloxacin Diarrhea  . Doxycycline Diarrhea and Nausea And Vomiting  . Nsaids Swelling  . Amoxicillin Rash  . Cephalexin Rash    Past Medical History  Diagnosis Date  . CKD (chronic kidney disease), stage IV   . Hypertension   . Coronary artery disease     Cabg 1992  . Neuropathy   . Cancer 2002    prostate sp radiation  . Dehydration   .  Weight loss   . Failure to thrive in childhood     In Adult  . Anemia   . History of TIAs     Past Surgical History  Procedure Laterality Date  . Coronary artery bypass graft      1992  . Dg av dialysis  shunt access exist*l* or      History  Smoking status  . Former Smoker  Smokeless tobacco  . Not on file    History  Alcohol Use  . Yes    Comment: occasional    Family History  Problem Relation Age of Onset  . Heart disease Mother   . Heart disease Brother     Review of Systems: As noted in history of present illness. All other systems were reviewed and are negative.  Physical Exam: BP 165/71  Pulse 96  Ht 5\' 4"  (1.626 m)  Wt 115 lb 1.9 oz (52.218 kg)  BMI 19.75 kg/m2 He is an elderly, thin, white male in no acute distress. HEENT is normal.  Neck is supple without JVD, adenopathy, thyromegaly, or bruits. Lungs are clear. Cardiac exam reveals an irregular rhythm with a rate of  92 beats per minute. There is a soft grade 1/6 systolic murmur at the left sternal border. Abdomen is soft and nontender without masses or bruits. Extremities reveal 1+ edema below the knees bilaterally. His skin is dry and scaly. He is alert and oriented x3. Cranial nerves II through XII are intact.  LABORATORY DATA:   Assessment / Plan: 1. History of atrial arrhythmia with wandering atrial pacemaker and PACs. Asymptomatic.  2. CAD with remote CABG- continue Plavix since he is ASA allergic. On statin. No symptoms.   3. HTN   4. CKD.

## 2014-09-07 ENCOUNTER — Ambulatory Visit
Admission: RE | Admit: 2014-09-07 | Discharge: 2014-09-07 | Disposition: A | Discharge status: Home / Self Care | Payer: Medicare Other | Source: Ambulatory Visit | Attending: Nephrology | Admitting: Nephrology

## 2014-09-07 ENCOUNTER — Other Ambulatory Visit: Payer: Self-pay | Admitting: Nephrology

## 2014-09-07 DIAGNOSIS — N185 Chronic kidney disease, stage 5: Secondary | ICD-10-CM

## 2014-09-09 ENCOUNTER — Other Ambulatory Visit: Payer: Self-pay | Admitting: Urology

## 2014-09-13 NOTE — Progress Notes (Signed)
Called and asked for orders to be released to sign and held surgery 09-26-04 pre op 09-20-14 Thanks

## 2014-09-19 ENCOUNTER — Telehealth: Payer: Self-pay | Admitting: Cardiology

## 2014-09-19 NOTE — Telephone Encounter (Signed)
Returned call to [place] at [facility] [place] advised ok to hold plavix 5 days prior to surgery.Surgical clearance form signed by Dr.Jordan and faxed back to fax # [phone].

## 2014-09-19 NOTE — Telephone Encounter (Signed)
Calling because he is having surgery and wants to know when is he supposed to stop his plavix . Please Call    Thanks

## 2014-09-19 NOTE — Telephone Encounter (Signed)
Returned call to patient Dr.Jordan advised ok to stop plavix 5 days prior to surgery.

## 2014-09-19 NOTE — Telephone Encounter (Signed)
Returned call to [place] at [facility] no answer.Left message on personal voice mail never received surgical clearance.Advised to re fax to fax # [phone].

## 2014-09-19 NOTE — Progress Notes (Signed)
Pt has PAT visit on 09/20/2014. Need orders placed in EPIC. Thanks.

## 2014-09-19 NOTE — Telephone Encounter (Signed)
New message         Checking on the status of a surgical clearance that was faxed over / pt surgery is Monday 12/7

## 2014-09-19 NOTE — Telephone Encounter (Signed)
Mr. Monje is calling because he is having surgery and needs to know when shall he stop his plavix prior to the surgery

## 2014-09-20 ENCOUNTER — Other Ambulatory Visit: Payer: Self-pay | Admitting: Radiology

## 2014-09-20 ENCOUNTER — Ambulatory Visit (HOSPITAL_COMMUNITY)
Admission: RE | Admit: 2014-09-20 | Discharge: 2014-09-20 | Disposition: A | Discharge status: Home / Self Care | Payer: Medicare Other | Source: Ambulatory Visit | Attending: Anesthesiology | Admitting: Anesthesiology

## 2014-09-20 ENCOUNTER — Telehealth: Payer: Self-pay

## 2014-09-20 ENCOUNTER — Other Ambulatory Visit: Payer: Self-pay | Admitting: Urology

## 2014-09-20 ENCOUNTER — Encounter (HOSPITAL_COMMUNITY): Payer: Self-pay

## 2014-09-20 ENCOUNTER — Encounter (HOSPITAL_COMMUNITY)
Admission: RE | Admit: 2014-09-20 | Discharge: 2014-09-20 | Disposition: A | Discharge status: Home / Self Care | Payer: Medicare Other | Source: Ambulatory Visit | Attending: Urology | Admitting: Urology

## 2014-09-20 DIAGNOSIS — N135 Crossing vessel and stricture of ureter without hydronephrosis: Secondary | ICD-10-CM

## 2014-09-20 DIAGNOSIS — Z8546 Personal history of malignant neoplasm of prostate: Secondary | ICD-10-CM | POA: Diagnosis not present

## 2014-09-20 DIAGNOSIS — Z87891 Personal history of nicotine dependence: Secondary | ICD-10-CM | POA: Diagnosis not present

## 2014-09-20 DIAGNOSIS — Z881 Allergy status to other antibiotic agents status: Secondary | ICD-10-CM | POA: Diagnosis not present

## 2014-09-20 DIAGNOSIS — N184 Chronic kidney disease, stage 4 (severe): Secondary | ICD-10-CM | POA: Diagnosis not present

## 2014-09-20 DIAGNOSIS — Z888 Allergy status to other drugs, medicaments and biological substances status: Secondary | ICD-10-CM | POA: Diagnosis not present

## 2014-09-20 DIAGNOSIS — F329 Major depressive disorder, single episode, unspecified: Secondary | ICD-10-CM | POA: Diagnosis not present

## 2014-09-20 DIAGNOSIS — G709 Myoneural disorder, unspecified: Secondary | ICD-10-CM | POA: Diagnosis not present

## 2014-09-20 DIAGNOSIS — Z8673 Personal history of transient ischemic attack (TIA), and cerebral infarction without residual deficits: Secondary | ICD-10-CM | POA: Diagnosis not present

## 2014-09-20 DIAGNOSIS — N1330 Unspecified hydronephrosis: Secondary | ICD-10-CM | POA: Diagnosis present

## 2014-09-20 DIAGNOSIS — Z8249 Family history of ischemic heart disease and other diseases of the circulatory system: Secondary | ICD-10-CM | POA: Diagnosis not present

## 2014-09-20 DIAGNOSIS — I2510 Atherosclerotic heart disease of native coronary artery without angina pectoris: Secondary | ICD-10-CM | POA: Diagnosis not present

## 2014-09-20 DIAGNOSIS — I129 Hypertensive chronic kidney disease with stage 1 through stage 4 chronic kidney disease, or unspecified chronic kidney disease: Secondary | ICD-10-CM | POA: Diagnosis not present

## 2014-09-20 DIAGNOSIS — Z951 Presence of aortocoronary bypass graft: Secondary | ICD-10-CM | POA: Diagnosis not present

## 2014-09-20 DIAGNOSIS — I10 Essential (primary) hypertension: Secondary | ICD-10-CM

## 2014-09-20 HISTORY — DX: Major depressive disorder, single episode, unspecified: F32.9

## 2014-09-20 HISTORY — DX: Arteriovenous fistula, acquired: I77.0

## 2014-09-20 HISTORY — DX: Hematuria, unspecified: R31.9

## 2014-09-20 HISTORY — DX: Angina pectoris, unspecified: I20.9

## 2014-09-20 HISTORY — DX: Other chronic pain: G89.29

## 2014-09-20 HISTORY — DX: Cellulitis of right lower limb: L03.115

## 2014-09-20 HISTORY — DX: Cardiac arrhythmia, unspecified: I49.9

## 2014-09-20 HISTORY — DX: Benign prostatic hyperplasia without lower urinary tract symptoms: N40.0

## 2014-09-20 HISTORY — DX: Depression, unspecified: F32.A

## 2014-09-20 HISTORY — DX: Cerebral infarction, unspecified: I63.9

## 2014-09-20 HISTORY — DX: Unspecified urinary incontinence: R32

## 2014-09-20 HISTORY — DX: Full incontinence of feces: R15.9

## 2014-09-20 HISTORY — DX: Localized edema: R60.0

## 2014-09-20 HISTORY — DX: Secondary hyperparathyroidism of renal origin: N25.81

## 2014-09-20 HISTORY — DX: Myoneural disorder, unspecified: G70.9

## 2014-09-20 HISTORY — DX: Unspecified hearing loss, unspecified ear: H91.90

## 2014-09-20 LAB — CBC
HCT: 31.5 % — ABNORMAL LOW (ref 39.0–52.0)
HEMOGLOBIN: 10 g/dL — AB (ref 13.0–17.0)
MCH: 30 pg (ref 26.0–34.0)
MCHC: 31.7 g/dL (ref 30.0–36.0)
MCV: 94.6 fL (ref 78.0–100.0)
Platelets: 243 10*3/uL (ref 150–400)
RBC: 3.33 MIL/uL — AB (ref 4.22–5.81)
RDW: 14.8 % (ref 11.5–15.5)
WBC: 7.1 10*3/uL (ref 4.0–10.5)

## 2014-09-20 LAB — BASIC METABOLIC PANEL
ANION GAP: 19 — AB (ref 5–15)
BUN: 110 mg/dL — ABNORMAL HIGH (ref 6–23)
CHLORIDE: 103 meq/L (ref 96–112)
CO2: 24 meq/L (ref 19–32)
CREATININE: 5.85 mg/dL — AB (ref 0.50–1.35)
Calcium: 9.7 mg/dL (ref 8.4–10.5)
GFR calc Af Amer: 9 mL/min — ABNORMAL LOW (ref >90)
GFR calc non Af Amer: 8 mL/min — ABNORMAL LOW (ref >90)
Glucose, Bld: 131 mg/dL — ABNORMAL HIGH (ref 70–99)
Potassium: 5 mEq/L (ref 3.7–5.3)
SODIUM: 146 meq/L (ref 137–147)

## 2014-09-20 NOTE — Telephone Encounter (Signed)
Patient is here with his wife thinking that he should be having some blood work. Scheduled in Epic for PAT at [facility] hospital for upcoming urological surgery. i don't see anything else to indicate that he needs labs here. Told me has an appointment today at 3:00 pm, so  I reiterated  that this is pre-op testing and they would draw blood there. Patient then voiced understanding.

## 2014-09-20 NOTE — Patient Instructions (Addendum)
20    PT DUE TO BE AT RADIOLOGY ON September 21, 2014 AT 7:30 am NO FOOD OR DRINK AFTER MIDNIGHT  PLAN ON STAYING APPROX 6 HOURS AND WILL NEED A DRIVE TO TAKE YOU  HOME IF ANY QUESTIONS CAN CONTACT RADIOLOGY AT [phone]    Your procedure is scheduled on:   Monday September 26, 2014  Report to [facility] Main Entrance and follow signs to  [place] arrive at [place] AM.   Call this number if you have problems the morning of surgery [phone] or Presurgical Testing [phone].   Remember:  Do not eat food or drink liquids :After Midnight.     Take these medicines the morning of surgery with A SIP OF WATER: Gabapentin (Neurotin);Methadone;Nifedipine (Procardia)                               You may not have any metal on your body including hair pins and piercings  Do not wear jewelry, lotions, powders, or deodorant.  Men may shave face and neck.               Do not bring valuables to the hospital. [place].  Contacts, dentures or bridgework may not be worn into surgery.     Patients discharged the day of surgery will not be allowed to drive home.  Name and phone number of your driver:Jaymes Graff (daughter) [phone]   ________________________________________________________________________  [facility] - Preparing for Surgery Before surgery, you can play an important role.  Because skin is not sterile, your skin needs to be as free of germs as possible.  You can reduce the number of germs on your skin by washing with CHG (chlorahexidine gluconate) soap before surgery.  CHG is an antiseptic cleaner which kills germs and bonds with the skin to continue killing germs even after washing. Please DO NOT use if you have an allergy to CHG or antibacterial soaps.  If your skin becomes reddened/irritated stop using the CHG and inform your nurse when you arrive at Short Stay. Do not shave (including legs and underarms) for at least 48 hours  prior to the first CHG shower.  You may shave your face/neck. Please follow these instructions carefully:  1.  Shower with CHG Soap the night before surgery and the  morning of Surgery.  2.  If you choose to wash your hair, wash your hair first as usual with your  normal  shampoo.  3.  After you shampoo, rinse your hair and body thoroughly to remove the  shampoo.                           4.  Use CHG as you would any other liquid soap.  You can apply chg directly  to the skin and wash                       Gently with a scrungie or clean washcloth.  5.  Apply the CHG Soap to your body ONLY FROM THE NECK DOWN.   Do not use on face/ open                           Wound or open sores. Avoid contact with eyes, ears mouth and genitals (private parts).  Wash face,  Genitals (private parts) with your normal soap.             6.  Wash thoroughly, paying special attention to the area where your surgery  will be performed.  7.  Thoroughly rinse your body with warm water from the neck down.  8.  DO NOT shower/wash with your normal soap after using and rinsing off  the CHG Soap.                9.  Pat yourself dry with a clean towel.            10.  Wear clean pajamas.            11.  Place clean sheets on your bed the night of your first shower and do not  sleep with pets. Day of Surgery : Do not apply any lotions/deodorants the morning of surgery.  Please wear clean clothes to the hospital/surgery center.  FAILURE TO FOLLOW THESE INSTRUCTIONS MAY RESULT IN THE CANCELLATION OF YOUR SURGERY PATIENT SIGNATURE_________________________________  NURSE SIGNATURE__________________________________  ________________________________________________________________________

## 2014-09-20 NOTE — Progress Notes (Signed)
[facility] in regards to pt taking zocor and noted per 09/06/2014 MD office note pt has allergy to zocor spoke with [place]. Also spoke with Dr Delma Post in regards to pts hx and ekg readings from 03/07/2014 and 09/20/2014. Anesthesia aware and will evaluate day of surgery.

## 2014-09-21 ENCOUNTER — Encounter (HOSPITAL_COMMUNITY): Payer: Self-pay

## 2014-09-21 ENCOUNTER — Ambulatory Visit (HOSPITAL_COMMUNITY)
Admission: RE | Admit: 2014-09-21 | Discharge: 2014-09-21 | Disposition: A | Discharge status: Home / Self Care | Payer: Medicare Other | Source: Ambulatory Visit | Attending: Urology | Admitting: Urology

## 2014-09-21 ENCOUNTER — Other Ambulatory Visit: Payer: Self-pay | Admitting: Urology

## 2014-09-21 ENCOUNTER — Observation Stay (HOSPITAL_COMMUNITY)
Admission: RE | Admit: 2014-09-21 | Discharge: 2014-09-22 | Disposition: A | Discharge status: Home / Self Care | Payer: Medicare Other | Source: Ambulatory Visit | Attending: Urology | Admitting: Urology

## 2014-09-21 ENCOUNTER — Ambulatory Visit (HOSPITAL_COMMUNITY)
Admission: RE | Admit: 2014-09-21 | Discharge: 2014-09-21 | Disposition: A | Discharge status: Home / Self Care | Payer: Medicare Other | Source: Ambulatory Visit | Attending: Radiology | Admitting: Radiology

## 2014-09-21 VITALS — BP 134/66 | HR 68 | Temp 98.3°F | Resp 16 | Ht 64.0 in | Wt 126.5 lb

## 2014-09-21 DIAGNOSIS — I2510 Atherosclerotic heart disease of native coronary artery without angina pectoris: Secondary | ICD-10-CM | POA: Insufficient documentation

## 2014-09-21 DIAGNOSIS — Z8249 Family history of ischemic heart disease and other diseases of the circulatory system: Secondary | ICD-10-CM | POA: Insufficient documentation

## 2014-09-21 DIAGNOSIS — N1330 Unspecified hydronephrosis: Secondary | ICD-10-CM | POA: Diagnosis not present

## 2014-09-21 DIAGNOSIS — Z8546 Personal history of malignant neoplasm of prostate: Secondary | ICD-10-CM | POA: Insufficient documentation

## 2014-09-21 DIAGNOSIS — D494 Neoplasm of unspecified behavior of bladder: Secondary | ICD-10-CM | POA: Insufficient documentation

## 2014-09-21 DIAGNOSIS — Z951 Presence of aortocoronary bypass graft: Secondary | ICD-10-CM | POA: Insufficient documentation

## 2014-09-21 DIAGNOSIS — F329 Major depressive disorder, single episode, unspecified: Secondary | ICD-10-CM | POA: Insufficient documentation

## 2014-09-21 DIAGNOSIS — Z881 Allergy status to other antibiotic agents status: Secondary | ICD-10-CM | POA: Insufficient documentation

## 2014-09-21 DIAGNOSIS — I129 Hypertensive chronic kidney disease with stage 1 through stage 4 chronic kidney disease, or unspecified chronic kidney disease: Secondary | ICD-10-CM | POA: Insufficient documentation

## 2014-09-21 DIAGNOSIS — G709 Myoneural disorder, unspecified: Secondary | ICD-10-CM | POA: Insufficient documentation

## 2014-09-21 DIAGNOSIS — Z8673 Personal history of transient ischemic attack (TIA), and cerebral infarction without residual deficits: Secondary | ICD-10-CM | POA: Insufficient documentation

## 2014-09-21 DIAGNOSIS — N184 Chronic kidney disease, stage 4 (severe): Secondary | ICD-10-CM | POA: Insufficient documentation

## 2014-09-21 DIAGNOSIS — Z87891 Personal history of nicotine dependence: Secondary | ICD-10-CM | POA: Insufficient documentation

## 2014-09-21 DIAGNOSIS — Z888 Allergy status to other drugs, medicaments and biological substances status: Secondary | ICD-10-CM | POA: Insufficient documentation

## 2014-09-21 DIAGNOSIS — N135 Crossing vessel and stricture of ureter without hydronephrosis: Secondary | ICD-10-CM

## 2014-09-21 LAB — CBC WITH DIFFERENTIAL/PLATELET
Basophils Absolute: 0 10*3/uL (ref 0.0–0.1)
Basophils Relative: 0 % (ref 0–1)
EOS ABS: 0.1 10*3/uL (ref 0.0–0.7)
Eosinophils Relative: 2 % (ref 0–5)
HCT: 31.2 % — ABNORMAL LOW (ref 39.0–52.0)
Hemoglobin: 9.9 g/dL — ABNORMAL LOW (ref 13.0–17.0)
LYMPHS ABS: 1.1 10*3/uL (ref 0.7–4.0)
LYMPHS PCT: 17 % (ref 12–46)
MCH: 29.4 pg (ref 26.0–34.0)
MCHC: 31.7 g/dL (ref 30.0–36.0)
MCV: 92.6 fL (ref 78.0–100.0)
Monocytes Absolute: 0.4 10*3/uL (ref 0.1–1.0)
Monocytes Relative: 7 % (ref 3–12)
NEUTROS PCT: 74 % (ref 43–77)
Neutro Abs: 4.8 10*3/uL (ref 1.7–7.7)
PLATELETS: 243 10*3/uL (ref 150–400)
RBC: 3.37 MIL/uL — AB (ref 4.22–5.81)
RDW: 14.7 % (ref 11.5–15.5)
WBC: 6.5 10*3/uL (ref 4.0–10.5)

## 2014-09-21 LAB — BASIC METABOLIC PANEL
Anion gap: 21 — ABNORMAL HIGH (ref 5–15)
BUN: 113 mg/dL — ABNORMAL HIGH (ref 6–23)
CO2: 23 mEq/L (ref 19–32)
Calcium: 9.6 mg/dL (ref 8.4–10.5)
Chloride: 99 mEq/L (ref 96–112)
Creatinine, Ser: 5.96 mg/dL — ABNORMAL HIGH (ref 0.50–1.35)
GFR, EST AFRICAN AMERICAN: 9 mL/min — AB (ref >90)
GFR, EST NON AFRICAN AMERICAN: 7 mL/min — AB (ref >90)
GLUCOSE: 123 mg/dL — AB (ref 70–99)
POTASSIUM: 3.7 meq/L (ref 3.7–5.3)
SODIUM: 143 meq/L (ref 137–147)

## 2014-09-21 LAB — PROTIME-INR
INR: 1.05 (ref 0.00–1.49)
PROTHROMBIN TIME: 13.8 s (ref 11.6–15.2)

## 2014-09-21 LAB — APTT: APTT: 33 s (ref 24–37)

## 2014-09-21 MED ORDER — IOHEXOL 300 MG/ML  SOLN
50.0000 mL | Freq: Once | INTRAMUSCULAR | Status: AC | PRN
  Administered 2014-09-21: 50 mL

## 2014-09-21 MED ORDER — SODIUM CHLORIDE 0.45 % IV SOLN
INTRAVENOUS | Status: DC
  Administered 2014-09-21: 25 mL/h via INTRAVENOUS

## 2014-09-21 MED ORDER — LIDOCAINE HCL 1 % IJ SOLN
INTRAMUSCULAR | Status: AC
  Filled 2014-09-21: qty 20

## 2014-09-21 MED ORDER — HYDROCODONE-ACETAMINOPHEN 5-325 MG PO TABS
1.0000 | ORAL_TABLET | ORAL | Status: DC | PRN
  Administered 2014-09-22 (×2): 1 via ORAL
  Filled 2014-09-21 (×2): qty 1

## 2014-09-21 MED ORDER — LIDOCAINE-EPINEPHRINE 2 %-1:100000 IJ SOLN
INTRAMUSCULAR | Status: AC
  Filled 2014-09-21: qty 2

## 2014-09-21 MED ORDER — ACETAMINOPHEN 325 MG PO TABS: 650.0000 mg | ORAL_TABLET | ORAL | Status: DC | PRN

## 2014-09-21 MED ORDER — MIDAZOLAM HCL 2 MG/2ML IJ SOLN
INTRAMUSCULAR | Status: AC | PRN
  Administered 2014-09-21 (×3): 0.5 mg via INTRAVENOUS

## 2014-09-21 MED ORDER — FENTANYL CITRATE 0.05 MG/ML IJ SOLN
INTRAMUSCULAR | Status: AC
  Filled 2014-09-21: qty 2

## 2014-09-21 MED ORDER — SODIUM CHLORIDE 0.9 % IV SOLN
INTRAVENOUS | Status: DC
  Administered 2014-09-21: 08:00:00 via INTRAVENOUS

## 2014-09-21 MED ORDER — FENTANYL CITRATE 0.05 MG/ML IJ SOLN
INTRAMUSCULAR | Status: AC | PRN
  Administered 2014-09-21 (×2): 25 ug via INTRAVENOUS

## 2014-09-21 MED ORDER — MIDAZOLAM HCL 2 MG/2ML IJ SOLN
INTRAMUSCULAR | Status: AC
  Filled 2014-09-21: qty 2

## 2014-09-21 MED ORDER — CLINDAMYCIN PHOSPHATE 600 MG/50ML IV SOLN
600.0000 mg | Freq: Once | INTRAVENOUS | Status: AC
  Administered 2014-09-21: 600 mg via INTRAVENOUS
  Filled 2014-09-21: qty 50

## 2014-09-21 NOTE — Procedures (Signed)
Successful [place] and fluoroscopic guided placement of bilateral 10 Fr PCN with end coiled and locked in the renal pelvis. PCN connected to gravity bag. No immediate post procedural complications.

## 2014-09-21 NOTE — H&P (Signed)
Urology History and Physical Exam  CC: blocked kidneys  HPI: 80 year old male is admitted today, after placement of bilateral percutaneous nephrostomy tubes by Dr. Pascal Lux in interventional radiology.  He was recently diagnosed with high lateral hydronephrosis, and a posterior bladder wall mass.  The patient has worsening renal insufficiency, and is scheduled next week for cystoscopy, tumor resection cystoscopically as well as attempted bilateral stent placement.  Due to his worsening renal insufficiency, he is admitted at this time following placement of his percutaneous nephrostomy tubes .  PMH: Past Medical History  Diagnosis Date  . CKD (chronic kidney disease), stage IV   . Hypertension   . Neuropathy   . Cancer 2002    prostate sp radiation  . Dehydration   . Weight loss   . Failure to thrive in childhood     In Adult  . Anemia   . History of TIAs   . Coronary artery disease     CABG May of 1991 six bypass  . Anginal pain     hx of  prior to CABG  . Stroke     hx of TIA's times 3  . BPH (benign prostatic hyperplasia)   . Lower extremity edema   . Cellulitis of right leg     hx of treated with clindamycin   . Neuromuscular disorder     peripheral neuropathy  . Dysrhythmia     hx of atrial arrhythymia per [facility] history 09/06/2014   . Chronic pain   . Urinary incontinence   . Hematuria     hx of per [facility] history 09/06/2014  . Depression     hx of per [facility] history 09/06/2014   . Secondary hyperparathyroidism     per [facility] history 09/06/2014   . Rectal incontinence     hx of per [facility] history 09/06/2014   . Hearing loss   . Arteriovenous fistula     left brachiocephalic     PSH: Past Surgical History  Procedure Laterality Date  . Coronary artery bypass graft      1992  . Dg av dialysis  shunt access exist*l* or      left arm   . Total hip arthroplasty      right   . Transurethral resection of prostate     1989; [facility] 4-5/10 external beam radiotherapy completed 03/14/1989  . Tonsillectomy    . Colonscopy       polyps removed  . Eye surgery      cataract removed per left eye   . Hernia repair      inguinal bilateral     Allergies: Allergies  Allergen Reactions  . Aspirin Swelling    Swelling of the face   . Ciprofloxacin Diarrhea  . Doxycycline Diarrhea and Nausea And Vomiting  . Amoxicillin Rash  . Cephalexin Rash    Medications: Prescriptions prior to admission  Medication Sig Dispense Refill Last Dose  . clopidogrel (PLAVIX) 75 MG tablet Take 75 mg by mouth every morning.    09/17/14  . furosemide (LASIX) 80 MG tablet Take 80 mg by mouth 2 (two) times daily.    09/21/2014 at 0600  . gabapentin (NEURONTIN) 100 MG capsule Take 100 mg by mouth every morning.    09/20/2014 at Unknown time  . iron polysaccharides (NIFEREX) 150 MG capsule Take 150 mg by mouth 2 (two) times daily.   09/20/2014 at Unknown time  . lidocaine (LIDODERM) 5 % Place  1 patch onto the skin every 12 (twelve) hours as needed (for pain). Remove & Discard patch within 12 hours or as directed by MD   unknown  . methadone (DOLOPHINE) 5 MG tablet Take 5 mg by mouth every 6 (six) hours.    09/20/2014 at 0600  . Multiple Vitamins-Minerals (MULTIVITAMINS THER. W/MINERALS) TABS Take 1 tablet by mouth every morning.    09/17/2014 at Unknown time  . NIFEdipine (PROCARDIA-XL/ADALAT CC) 30 MG 24 hr tablet Take 30 mg by mouth 2 (two) times daily.   09/21/2014 at 0600  . simvastatin (ZOCOR) 40 MG tablet Take 40 mg by mouth every evening.   09/20/2014 at Unknown time     Social History: History   Social History  . Marital Status: Married    Spouse Name: N/A    Number of Children: 3  . Years of Education: N/A   Occupational History  . printer    Social History Main Topics  . Smoking status: Former Research scientist (life sciences)  . Smokeless tobacco: Never Used  . Alcohol Use: No  . Drug Use: No  . Sexual Activity: Not on file   Other Topics  Concern  . Not on file   Social History Narrative    Family History: Family History  Problem Relation Age of Onset  . Heart disease Mother   . Heart disease Brother     Review of Systems: Positive: ankle swelling Negative:   A further 10 point review of systems was negative except what is listed in the HPI.                  Physical Exam: @VITALS2 @ General: No acute distress.  Awake. Head:  Normocephalic.  Atraumatic. ENT:  EOMI.  Mucous membranes moist Neck:  Supple.  No lymphadenopathy. CV:  S1 present. S2 present. Regular rate.pitting edema to the knees bilaterally Pulmonary: Equal effort bilaterally.  Clear to auscultation bilaterally. Abdomen: Soft. none tender to palpation. Skin:  Normal turgor.  No visible rash. Extremity: No gross deformity of bilateral upper extremities.  No gross deformity of     lower extremities. Neurologic: Alert. Appropriate mood.    Studies:  Recent Labs     09/20/14  1440  09/21/14  0750  HGB  10.0*  9.9*  WBC  7.1  6.5  PLT  243  243    Recent Labs     09/20/14  1440  09/21/14  0750  NA  146  143  K  5.0  3.7  CL  103  99  CO2  24  23  BUN  110*  113*  CREATININE  5.85*  5.96*  CALCIUM  9.7  9.6  GFRNONAA  8*  7*  GFRAA  9*  9*     Recent Labs     09/21/14  0750  INR  1.05  APTT  33     Invalid input(s): ABG    Assessment:  Bladder mass with bilateral hydronephrosis, scheduled for cystoscopy and bladder biopsy on 7 December  Worsening renal insufficiency  Plan: The patient had percutaneous nephrostomy tubes placed today  We will observe him at least overnight, specifically for postobstructive diuresis and due to the fact that he is 80 years of age

## 2014-09-21 NOTE — Progress Notes (Addendum)
09-21-14 1000 AM labs viewable in Epic- please note all, and CXR results. Call to office Dr. Diona Fanti, left message for "Noemi Chapel" of labs faxed per Epic and are viewable.

## 2014-09-21 NOTE — H&P (Signed)
Chief Complaint: "I'm here for kidney drains" Referring Physician:Dahlstedt HPI: Benjamin Macdonald is an 80 y.o. male with working diagnosis of bladder mass. This is causing bilateral hydronephrosis, which is now causing a decline in his renal function. Cr has risen to 5.96. He is now scheduled for bilateral perc nephrostomies and is then scheduled for operative management of the bladder tumor. PMHx, meds, labs, imaging reviewed.  Past Medical History:  Past Medical History  Diagnosis Date  . CKD (chronic kidney disease), stage IV   . Hypertension   . Neuropathy   . Cancer 2002    prostate sp radiation  . Dehydration   . Weight loss   . Failure to thrive in childhood     In Adult  . Anemia   . History of TIAs   . Coronary artery disease     CABG May of 1991 six bypass  . Anginal pain     hx of  prior to CABG  . Stroke     hx of TIA's times 3  . BPH (benign prostatic hyperplasia)   . Lower extremity edema   . Cellulitis of right leg     hx of treated with clindamycin   . Neuromuscular disorder     peripheral neuropathy  . Dysrhythmia     hx of atrial arrhythymia per [facility] history 09/06/2014   . Chronic pain   . Urinary incontinence   . Hematuria     hx of per [facility] history 09/06/2014  . Depression     hx of per [facility] history 09/06/2014   . Secondary hyperparathyroidism     per [facility] history 09/06/2014   . Rectal incontinence     hx of per [facility] history 09/06/2014   . Hearing loss   . Arteriovenous fistula     left brachiocephalic     Past Surgical History:  Past Surgical History  Procedure Laterality Date  . Coronary artery bypass graft      1992  . Dg av dialysis  shunt access exist*l* or      left arm   . Total hip arthroplasty      right   . Transurethral resection of prostate      1989; gleason 4-5/10 external beam radiotherapy completed 03/14/1989  . Tonsillectomy    . Colonscopy       polyps removed   . Eye surgery      cataract removed per left eye   . Hernia repair      inguinal bilateral     Family History:  Family History  Problem Relation Age of Onset  . Heart disease Mother   . Heart disease Brother     Social History:  reports that he has quit smoking. He has never used smokeless tobacco. He reports that he does not drink alcohol or use illicit drugs.  Allergies:  Allergies  Allergen Reactions  . Aspirin Swelling    Swelling of the face   . Ciprofloxacin Diarrhea  . Doxycycline Diarrhea and Nausea And Vomiting  . Amoxicillin Rash  . Cephalexin Rash    Medications:   Medication List    ASK your doctor about these medications        clopidogrel 75 MG tablet  Commonly known as:  PLAVIX  Take 75 mg by mouth every morning.     furosemide 80 MG tablet  Commonly known as:  LASIX  Take 80 mg by mouth 2 (two) times daily.  gabapentin 100 MG capsule  Commonly known as:  NEURONTIN  Take 100 mg by mouth every morning.     iron polysaccharides 150 MG capsule  Commonly known as:  NIFEREX  Take 150 mg by mouth 2 (two) times daily.     lidocaine 5 %  Commonly known as:  LIDODERM  Place 1 patch onto the skin every 12 (twelve) hours as needed (for pain). Remove & Discard patch within 12 hours or as directed by MD     methadone 5 MG tablet  Commonly known as:  DOLOPHINE  Take 5 mg by mouth every 6 (six) hours.     multivitamins ther. w/minerals Tabs tablet  Take 1 tablet by mouth every morning.     NIFEdipine 30 MG 24 hr tablet  Commonly known as:  PROCARDIA-XL/ADALAT CC  Take 30 mg by mouth 2 (two) times daily.     simvastatin 40 MG tablet  Commonly known as:  ZOCOR  Take 40 mg by mouth every evening.        Please HPI for pertinent positives, otherwise complete 10 system ROS negative.  Physical Exam: BP 146/69 mmHg  Temp(Src) 97.9 F (36.6 C) (Oral)  Resp 20  Ht _0  (1.626 m)  Wt 126 lb 8 oz (57.38 kg)  BMI 21.70 kg/m2  SpO2 100% Body  mass index is 21.7 kg/(m^2).   General Appearance:  Alert, cooperative, no distress, appears stated age  Head:  Normocephalic, without obvious abnormality, atraumatic  ENT: Unremarkable  Neck: Supple, symmetrical, trachea midline  Lungs:   Clear to auscultation bilaterally, no w/r/r, respirations unlabored without use of accessory muscles.  Chest Wall:  No tenderness or deformity  Heart:  Regular rate and rhythm, S1, S2 normal, no murmur, rub or gallop.  Neurologic: Normal affect, no gross deficits.  Labs: Results for orders placed or performed during the hospital encounter of 09/21/14 (from the past 48 hour(s))  APTT     Status: None   Collection Time: 09/21/14  7:50 AM  Result Value Ref Range   aPTT 33 24 - 37 seconds  Basic metabolic panel     Status: Abnormal   Collection Time: 09/21/14  7:50 AM  Result Value Ref Range   Sodium 143 137 - 147 mEq/L    Comment: REPEATED TO VERIFY   Potassium 3.7 3.7 - 5.3 mEq/L    Comment: DELTA CHECK NOTED REPEATED TO VERIFY    Chloride 99 96 - 112 mEq/L    Comment: REPEATED TO VERIFY   CO2 23 19 - 32 mEq/L    Comment: REPEATED TO VERIFY   Glucose, Bld 123 (H) 70 - 99 mg/dL   BUN 113 (H) 6 - 23 mg/dL   Creatinine, Ser 5.96 (H) 0.50 - 1.35 mg/dL   Calcium 9.6 8.4 - 10.5 mg/dL   GFR calc non Af Amer 7 (L) >90 mL/min   GFR calc Af Amer 9 (L) >90 mL/min    Comment: (NOTE) The eGFR has been calculated using the CKD EPI equation. This calculation has not been validated in all clinical situations. eGFR's persistently <90 mL/min signify possible Chronic Kidney Disease.    Anion gap 21 (H) 5 - 15    Comment: REPEATED TO VERIFY  CBC with Differential     Status: Abnormal   Collection Time: 09/21/14  7:50 AM  Result Value Ref Range   WBC 6.5 4.0 - 10.5 K/uL   RBC 3.37 (L) 4.22 - 5.81 MIL/uL   Hemoglobin 9.9 (L)  13.0 - 17.0 g/dL   HCT 31.2 (L) 39.0 - 52.0 %   MCV 92.6 78.0 - 100.0 fL   MCH 29.4 26.0 - 34.0 pg   MCHC 31.7 30.0 - 36.0 g/dL    RDW 14.7 11.5 - 15.5 %   Platelets 243 150 - 400 K/uL   Neutrophils Relative % 74 43 - 77 %   Neutro Abs 4.8 1.7 - 7.7 K/uL   Lymphocytes Relative 17 12 - 46 %   Lymphs Abs 1.1 0.7 - 4.0 K/uL   Monocytes Relative 7 3 - 12 %   Monocytes Absolute 0.4 0.1 - 1.0 K/uL   Eosinophils Relative 2 0 - 5 %   Eosinophils Absolute 0.1 0.0 - 0.7 K/uL   Basophils Relative 0 0 - 1 %   Basophils Absolute 0.0 0.0 - 0.1 K/uL  Protime-INR     Status: None   Collection Time: 09/21/14  7:50 AM  Result Value Ref Range   Prothrombin Time 13.8 11.6 - 15.2 seconds   INR 1.05 0.00 - 1.49    Imaging: Dg Chest 2 View  09/20/2014   CLINICAL DATA:  Preoperative for TURB.  History of prostate cancer.  EXAM: CHEST  2 VIEW  COMPARISON:  12/10/2011  FINDINGS: Prior CABG. Stably enlarged cardiopericardial silhouette, cardiothoracic indexed 53%. Atherosclerotic aortic arch. Emphysema. Blunting of both costophrenic angles, right greater than left. Thoracic spondylosis. No current pulmonary edema.  Dextroconvex thoracolumbar scoliosis.  IMPRESSION: 1. Small bilateral pleural effusions. 2. Suspected emphysema. 3. Enlarged cardiopericardial silhouette, without pulmonary edema.   Electronically Signed   By: Sherryl Barters M.D.   On: 09/20/2014 17:33    Assessment/Plan Bladder tumor Bilateral hydronephrosis with associated renal insufficiency For (B)PCN placement today Has been off Plavix 4-5 days. Explained procedure, risks/complications including bleeding, infection. Labs reviewed Consent signed in chart  Ascencion Dike PA-C 09/21/2014, 9:17 AM

## 2014-09-21 NOTE — Plan of Care (Signed)
Problem: Phase I Progression Outcomes Goal: Pain controlled with appropriate interventions Outcome: Completed/Met Date Met:  09/21/14     

## 2014-09-21 NOTE — Plan of Care (Signed)
Problem: Phase I Progression Outcomes Goal: Hemodynamically stable Outcome: Completed/Met Date Met:  09/21/14  Problem: Phase II Progression Outcomes Goal: Vital signs remain stable Outcome: Completed/Met Date Met:  09/21/14

## 2014-09-22 DIAGNOSIS — N1330 Unspecified hydronephrosis: Secondary | ICD-10-CM | POA: Diagnosis not present

## 2014-09-22 DIAGNOSIS — D494 Neoplasm of unspecified behavior of bladder: Secondary | ICD-10-CM | POA: Insufficient documentation

## 2014-09-22 LAB — BASIC METABOLIC PANEL
ANION GAP: 19 — AB (ref 5–15)
BUN: 110 mg/dL — ABNORMAL HIGH (ref 6–23)
CALCIUM: 9.4 mg/dL (ref 8.4–10.5)
CO2: 23 meq/L (ref 19–32)
Chloride: 100 mEq/L (ref 96–112)
Creatinine, Ser: 5.93 mg/dL — ABNORMAL HIGH (ref 0.50–1.35)
GFR calc Af Amer: 9 mL/min — ABNORMAL LOW (ref >90)
GFR calc non Af Amer: 7 mL/min — ABNORMAL LOW (ref >90)
Glucose, Bld: 111 mg/dL — ABNORMAL HIGH (ref 70–99)
POTASSIUM: 4 meq/L (ref 3.7–5.3)
SODIUM: 142 meq/L (ref 137–147)

## 2014-09-22 NOTE — Care Management Note (Signed)
    Page 1 of 1   09/22/2014     10:30:41 AM CARE MANAGEMENT NOTE 09/22/2014  Patient:  Benjamin Macdonald, Benjamin Macdonald   Account Number:  [id number]  Date Initiated:  09/22/2014  Documentation initiated by:  Sunday Spillers  Subjective/Objective Assessment:   80 yo male admitted for bilateral nephrostomy placement related to hydronephrosis. PTA lived at home with spouse.     Action/Plan:   Home when stable   Anticipated DC Date:  09/22/2014   Anticipated DC Plan:  [name]  CM consult      [facility]  HOME HEALTH   Choice offered to / List presented to:  C-3 Spouse        HH arranged  HH-1 RN  [place].   Status of service:  Completed, signed off Medicare Important Message given?   (If response is "NO", the following Medicare IM given date fields will be blank) Date Medicare IM given:   Medicare IM given by:   Date Additional Medicare IM given:   Additional Medicare IM given by:    Discharge Disposition:  [place]  Per UR Regulation:  Reviewed for med. necessity/level of care/duration of stay  If discussed at [place] of Stay Meetings, dates discussed:    Comments:  09-22-14 Sunday Spillers RN Cm [address] with patient and wife at bedside. Confused about [facility] services (thought a nurse would stay with patient full time) and what they would provide (intermittant care), education provided, nurse reinforced. Instruction to wife on care of nephrostomies.

## 2014-09-22 NOTE — Progress Notes (Signed)
UR completed 

## 2014-09-22 NOTE — Progress Notes (Signed)
Referring Physician(s): Dahlstedt,Stephen M  Subjective: Patient denies significant pain at PCN sites. He states he is going home today and has surgery scheduled for Monday.  Allergies: Aspirin; Ciprofloxacin; Doxycycline; Amoxicillin; and Cephalexin  Medications: Prior to Admission medications   Medication Sig Start Date End Date Taking? Authorizing Provider  clopidogrel (PLAVIX) 75 MG tablet Take 75 mg by mouth every morning.    Yes Historical Provider, MD  furosemide (LASIX) 80 MG tablet Take 80 mg by mouth 2 (two) times daily.    Yes Historical Provider, MD  gabapentin (NEURONTIN) 100 MG capsule Take 100 mg by mouth every morning.    Yes Historical Provider, MD  iron polysaccharides (NIFEREX) 150 MG capsule Take 150 mg by mouth 2 (two) times daily.   Yes Historical Provider, MD  lidocaine (LIDODERM) 5 % Place 1 patch onto the skin every 12 (twelve) hours as needed (for pain). Remove & Discard patch within 12 hours or as directed by MD   Yes Historical Provider, MD  methadone (DOLOPHINE) 5 MG tablet Take 5 mg by mouth every 6 (six) hours.    Yes Historical Provider, MD  Multiple Vitamins-Minerals (MULTIVITAMINS THER. W/MINERALS) TABS Take 1 tablet by mouth every morning.    Yes Historical Provider, MD  NIFEdipine (PROCARDIA-XL/ADALAT CC) 30 MG 24 hr tablet Take 30 mg by mouth 2 (two) times daily.   Yes Historical Provider, MD  simvastatin (ZOCOR) 40 MG tablet Take 40 mg by mouth every evening.   Yes Historical Provider, MD    Review of Systems  Vital Signs: BP 150/67 mmHg  Pulse 72  Temp(Src) 98.2 F (36.8 C) (Oral)  Resp 18  Ht 5\' 4"  (1.626 m)  Wt 126 lb 8 oz (57.38 kg)  BMI 21.70 kg/m2  SpO2 97%  Physical Exam General: Awake, NAD Back: B/L PCN sites intact, Right PCN bloody output 1,125 cc /24 hr (400 cc today), left PCN clear urine output 2,075 cc/24 hr (400 cc today) minimal tenderness at tube sites, no hematoma.  Imaging: Ct Abdomen Pelvis Wo Contrast  09/21/2014    CLINICAL DATA:  History of bladder mass, now with bilateral hydronephrosis. Please perform noncontrast abdominal CT to assist with staging and for planning purposes prior to potential bilateral percutaneous nephrostomy tube placement.  EXAM: CT ABDOMEN AND PELVIS WITHOUT CONTRAST  TECHNIQUE: Multidetector CT imaging of the abdomen and pelvis was performed following the standard protocol without IV contrast.  COMPARISON:  Renal ultrasound - 09/07/2014  FINDINGS: The lack of intravenous contrast limits the ability to evaluate solid abdominal organs.  Normal hepatic contour. Punctate (approximately 0.4 cm) radiopaque stone lying dependently within an otherwise normal-appearing gallbladder. Potential periportal edema is suspected on this noncontrast examination. No ascites.  There is severe right and moderate left-sided pelvicaliectasis and ureterectasis to the level of the urinary bladder, though note, evaluation of the distal aspect of the bilateral ureters is degraded secondary to lack of mesenteric fact as well as beam hardening artifact from the patient's right total hip replacement. No renal stones.  There is diffuse though slightly asymmetric urinary bladder wall thickening, left greater than right. No definitive bulky pelvic or inguinal lymphadenopathy on this noncontrast examination.  There is mild diffuse thickening of the bilateral adrenal glands without discrete nodule. The pancreas appears atrophic. Normal appearance of the spleen. Incidental note is made of a small splenule.  Moderate colonic stool burden without evidence of enteric obstruction. The bowel is normal in course and caliber without wall thickening or evidence of  obstruction. The appendix is not visualized however there is no inflammatory change within the right lower abdominal quadrant. No pneumoperitoneum, pneumatosis or portal venous gas.  Moderate to large amount of slightly irregular calcified atherosclerotic plaque within a tortuous but  normal caliber abdominal aorta. Calcified atherosclerotic plaque projects into the lumen of the mid aspect of the infrarenal abdominal aorta (represent axial image 23, series 2, coronal image 32, series 602). This finding is without associated periaortic stranding.  No definitive bulky retroperitoneal or mesenteric adenopathy on this noncontrast examination.  Limited visualization of the lower thorax demonstrates minimal consolidative opacities within the right lower lobe, the cranial extent of which is not imaged on this examination. There is minimal subsegmental atelectasis within the caudal segments of the right middle lobe and lingula. No pleural effusion.  Cardiomegaly. Calcifications within the mitral valve annulus. No pericardial effusion.  Mild scoliotic curvature of the thoracolumbar spine with associated multilevel moderate to severe DDD. Post right total hip replacement, the caudal extent is not imaged without definitive evidence of hardware failure or loosening.  Patient appears cachectic. Regional soft tissues appear otherwise normal.  IMPRESSION: 1. Diffuse though slightly asymmetric thickening of the urinary bladder wall, left greater than right. The patient's known urinary bladder mass is not well demonstrated on this noncontrast examination possibly attributable to streak artifact from the patient's right total hip replacement. 2. Severe right and moderate left pelvicaliectasis and ureterectasis to the level of the urinary bladder presumably secondary to the patient's known bladder mass. 3. No definite evidence of metastatic disease to the abdomen or pelvis on this noncontrast examination. 4. Consolidative opacities within the imaged right lower lobe, the cranial extent of which is not imaged on this examination. Further evaluation with staging chest CT could be performed as clinically indicated. 5. Cholelithiasis without evidence of cholecystitis. 6. Suspected mild periportal edema on this  noncontrast examination. Correlation with LFTs is recommended.   Electronically Signed   By: Sandi Mariscal M.D.   On: 09/21/2014 10:19   Dg Chest 2 View  09/20/2014   CLINICAL DATA:  Preoperative for TURB.  History of prostate cancer.  EXAM: CHEST  2 VIEW  COMPARISON:  12/10/2011  FINDINGS: Prior CABG. Stably enlarged cardiopericardial silhouette, cardiothoracic indexed 53%. Atherosclerotic aortic arch. Emphysema. Blunting of both costophrenic angles, right greater than left. Thoracic spondylosis. No current pulmonary edema.  Dextroconvex thoracolumbar scoliosis.  IMPRESSION: 1. Small bilateral pleural effusions. 2. Suspected emphysema. 3. Enlarged cardiopericardial silhouette, without pulmonary edema.   Electronically Signed   By: Sherryl Barters M.D.   On: 09/20/2014 17:33   Ir Perc Nephrostomy Left  09/21/2014   INDICATION: History of bladder mass, now with bilateral obstructive uropathy an elevated creatinine. Please perform bilateral percutaneous nephrostomy catheter placement for renal preservation purposes.  EXAM: 1. ULTRASOUND GUIDANCE FOR PUNCTURE OF THE BILATERAL RENAL COLLECTING SYSTEMS 2. BILATERAL PERCUTANEOUS NEPHROSTOMY TUBE PLACEMENT.  COMPARISON:  Renal ultrasound - 09/07/2014; CT abdomen pelvis - 09/21/2014  MEDICATIONS: Clindamycin 600 mg IV; The antibiotic was administered in an appropriate time frame prior to skin puncture.  ANESTHESIA/SEDATION: Fentanyl 50 mcg IV; Versed 1.5 mg IV  Total Moderate Sedation Time  50 minutes.  CONTRAST:  A total of 40 mL Isovue 300 was administered into both collecting systems  FLUOROSCOPY TIME:  5 minutes 6 seconds.  (48 mGy)  COMPLICATIONS: None immediate  PROCEDURE: The procedure, risks, benefits, and alternatives were explained to the patient. Questions regarding the procedure were encouraged and answered. The patient understands and  consents to the procedure. A timeout was performed prior to the initiation of the procedure.  The bilateral flanks were  prepped with Betadine in a sterile fashion, and a sterile drape was applied covering the operative field. A sterile gown and sterile gloves were used for the procedure. Local anesthesia was provided with 1% Lidocaine with epinephrine. Ultrasound was used to localize the left kidney. Under direct ultrasound guidance, a 21 gauge needle was advanced into the renal collecting system. An ultrasound image documentation was performed. Access within the collecting system was confirmed with the efflux of urine followed by contrast injection.  Over a Nitrex wire, the inner three [place] catheter of an Accustick set was advanced into the renal collecting system. Contrast injection was injected into the collecting system as several spot radiographs were obtained in various obliquities confirming puncture within a posterior inferior calix. As such, the tract was dilated with an Accustick stent. Over a guide wire, a 10-French percutaneous nephrostomy catheter was advanced into the collecting system where the coil was formed and locked. Contrast was injected and several sport radiographs were obtained in various obliquities confirming access.  The identical procedure was repeated for the contralateral right kidney ultimately allowing successful placement of a 10 French percutaneous nephrostomy catheter via a posterior inferior calyx.  Both catheters were secured at the skin exit sites with Prolene retention sutures and attached to gravity bags. Dressings were placed. The patient tolerated both procedures well without immediate postprocedural complication.  FINDINGS: Ultrasound scanning demonstrates moderate to severely dilated bilateral renal collecting systems.  Under direct ultrasound guidance, bilateral 10-French percutaneous nephrostomy catheters were placed via a posterior inferior calyx under intermittent fluoroscopic guidance. Contrast injection confirmed appropriate positioning.  IMPRESSION: Successful ultrasound and  fluoroscopic guided placement of a bilateral sided 10 [place] PCNs.   Electronically Signed   By: Sandi Mariscal M.D.   On: 09/21/2014 14:59   Ir Perc Nephrostomy Right  09/21/2014   INDICATION: History of bladder mass, now with bilateral obstructive uropathy an elevated creatinine. Please perform bilateral percutaneous nephrostomy catheter placement for renal preservation purposes.  EXAM: 1. ULTRASOUND GUIDANCE FOR PUNCTURE OF THE BILATERAL RENAL COLLECTING SYSTEMS 2. BILATERAL PERCUTANEOUS NEPHROSTOMY TUBE PLACEMENT.  COMPARISON:  Renal ultrasound - 09/07/2014; CT abdomen pelvis - 09/21/2014  MEDICATIONS: Clindamycin 600 mg IV; The antibiotic was administered in an appropriate time frame prior to skin puncture.  ANESTHESIA/SEDATION: Fentanyl 50 mcg IV; Versed 1.5 mg IV  Total Moderate Sedation Time  50 minutes.  CONTRAST:  A total of 40 mL Isovue 300 was administered into both collecting systems  FLUOROSCOPY TIME:  5 minutes 6 seconds.  (48 mGy)  COMPLICATIONS: None immediate  PROCEDURE: The procedure, risks, benefits, and alternatives were explained to the patient. Questions regarding the procedure were encouraged and answered. The patient understands and consents to the procedure. A timeout was performed prior to the initiation of the procedure.  The bilateral flanks were prepped with Betadine in a sterile fashion, and a sterile drape was applied covering the operative field. A sterile gown and sterile gloves were used for the procedure. Local anesthesia was provided with 1% Lidocaine with epinephrine. Ultrasound was used to localize the left kidney. Under direct ultrasound guidance, a 21 gauge needle was advanced into the renal collecting system. An ultrasound image documentation was performed. Access within the collecting system was confirmed with the efflux of urine followed by contrast injection.  Over a Nitrex wire, the inner three [place] catheter of an Accustick set was advanced  into the renal collecting  system. Contrast injection was injected into the collecting system as several spot radiographs were obtained in various obliquities confirming puncture within a posterior inferior calix. As such, the tract was dilated with an Accustick stent. Over a guide wire, a 10-French percutaneous nephrostomy catheter was advanced into the collecting system where the coil was formed and locked. Contrast was injected and several sport radiographs were obtained in various obliquities confirming access.  The identical procedure was repeated for the contralateral right kidney ultimately allowing successful placement of a 10 French percutaneous nephrostomy catheter via a posterior inferior calyx.  Both catheters were secured at the skin exit sites with Prolene retention sutures and attached to gravity bags. Dressings were placed. The patient tolerated both procedures well without immediate postprocedural complication.  FINDINGS: Ultrasound scanning demonstrates moderate to severely dilated bilateral renal collecting systems.  Under direct ultrasound guidance, bilateral 10-French percutaneous nephrostomy catheters were placed via a posterior inferior calyx under intermittent fluoroscopic guidance. Contrast injection confirmed appropriate positioning.  IMPRESSION: Successful ultrasound and fluoroscopic guided placement of a bilateral sided 10 [place] PCNs.   Electronically Signed   By: Sandi Mariscal M.D.   On: 09/21/2014 14:59   Ir US Guide Bx Asp/drain  09/21/2014   INDICATION: History of bladder mass, now with bilateral obstructive uropathy an elevated creatinine. Please perform bilateral percutaneous nephrostomy catheter placement for renal preservation purposes.  EXAM: 1. ULTRASOUND GUIDANCE FOR PUNCTURE OF THE BILATERAL RENAL COLLECTING SYSTEMS 2. BILATERAL PERCUTANEOUS NEPHROSTOMY TUBE PLACEMENT.  COMPARISON:  Renal ultrasound - 09/07/2014; CT abdomen pelvis - 09/21/2014  MEDICATIONS: Clindamycin 600 mg IV; The antibiotic was  administered in an appropriate time frame prior to skin puncture.  ANESTHESIA/SEDATION: Fentanyl 50 mcg IV; Versed 1.5 mg IV  Total Moderate Sedation Time  50 minutes.  CONTRAST:  A total of 40 mL Isovue 300 was administered into both collecting systems  FLUOROSCOPY TIME:  5 minutes 6 seconds.  (48 mGy)  COMPLICATIONS: None immediate  PROCEDURE: The procedure, risks, benefits, and alternatives were explained to the patient. Questions regarding the procedure were encouraged and answered. The patient understands and consents to the procedure. A timeout was performed prior to the initiation of the procedure.  The bilateral flanks were prepped with Betadine in a sterile fashion, and a sterile drape was applied covering the operative field. A sterile gown and sterile gloves were used for the procedure. Local anesthesia was provided with 1% Lidocaine with epinephrine. Ultrasound was used to localize the left kidney. Under direct ultrasound guidance, a 21 gauge needle was advanced into the renal collecting system. An ultrasound image documentation was performed. Access within the collecting system was confirmed with the efflux of urine followed by contrast injection.  Over a Nitrex wire, the inner three [place] catheter of an Accustick set was advanced into the renal collecting system. Contrast injection was injected into the collecting system as several spot radiographs were obtained in various obliquities confirming puncture within a posterior inferior calix. As such, the tract was dilated with an Accustick stent. Over a guide wire, a 10-French percutaneous nephrostomy catheter was advanced into the collecting system where the coil was formed and locked. Contrast was injected and several sport radiographs were obtained in various obliquities confirming access.  The identical procedure was repeated for the contralateral right kidney ultimately allowing successful placement of a 10 French percutaneous nephrostomy catheter  via a posterior inferior calyx.  Both catheters were secured at the skin exit sites with Prolene retention sutures  and attached to gravity bags. Dressings were placed. The patient tolerated both procedures well without immediate postprocedural complication.  FINDINGS: Ultrasound scanning demonstrates moderate to severely dilated bilateral renal collecting systems.  Under direct ultrasound guidance, bilateral 10-French percutaneous nephrostomy catheters were placed via a posterior inferior calyx under intermittent fluoroscopic guidance. Contrast injection confirmed appropriate positioning.  IMPRESSION: Successful ultrasound and fluoroscopic guided placement of a bilateral sided 10 [place] PCNs.   Electronically Signed   By: Sandi Mariscal M.D.   On: 09/21/2014 14:59   Ir US Guide Bx Asp/drain  09/21/2014   INDICATION: History of bladder mass, now with bilateral obstructive uropathy an elevated creatinine. Please perform bilateral percutaneous nephrostomy catheter placement for renal preservation purposes.  EXAM: 1. ULTRASOUND GUIDANCE FOR PUNCTURE OF THE BILATERAL RENAL COLLECTING SYSTEMS 2. BILATERAL PERCUTANEOUS NEPHROSTOMY TUBE PLACEMENT.  COMPARISON:  Renal ultrasound - 09/07/2014; CT abdomen pelvis - 09/21/2014  MEDICATIONS: Clindamycin 600 mg IV; The antibiotic was administered in an appropriate time frame prior to skin puncture.  ANESTHESIA/SEDATION: Fentanyl 50 mcg IV; Versed 1.5 mg IV  Total Moderate Sedation Time  50 minutes.  CONTRAST:  A total of 40 mL Isovue 300 was administered into both collecting systems  FLUOROSCOPY TIME:  5 minutes 6 seconds.  (48 mGy)  COMPLICATIONS: None immediate  PROCEDURE: The procedure, risks, benefits, and alternatives were explained to the patient. Questions regarding the procedure were encouraged and answered. The patient understands and consents to the procedure. A timeout was performed prior to the initiation of the procedure.  The bilateral flanks were prepped with  Betadine in a sterile fashion, and a sterile drape was applied covering the operative field. A sterile gown and sterile gloves were used for the procedure. Local anesthesia was provided with 1% Lidocaine with epinephrine. Ultrasound was used to localize the left kidney. Under direct ultrasound guidance, a 21 gauge needle was advanced into the renal collecting system. An ultrasound image documentation was performed. Access within the collecting system was confirmed with the efflux of urine followed by contrast injection.  Over a Nitrex wire, the inner three [place] catheter of an Accustick set was advanced into the renal collecting system. Contrast injection was injected into the collecting system as several spot radiographs were obtained in various obliquities confirming puncture within a posterior inferior calix. As such, the tract was dilated with an Accustick stent. Over a guide wire, a 10-French percutaneous nephrostomy catheter was advanced into the collecting system where the coil was formed and locked. Contrast was injected and several sport radiographs were obtained in various obliquities confirming access.  The identical procedure was repeated for the contralateral right kidney ultimately allowing successful placement of a 10 French percutaneous nephrostomy catheter via a posterior inferior calyx.  Both catheters were secured at the skin exit sites with Prolene retention sutures and attached to gravity bags. Dressings were placed. The patient tolerated both procedures well without immediate postprocedural complication.  FINDINGS: Ultrasound scanning demonstrates moderate to severely dilated bilateral renal collecting systems.  Under direct ultrasound guidance, bilateral 10-French percutaneous nephrostomy catheters were placed via a posterior inferior calyx under intermittent fluoroscopic guidance. Contrast injection confirmed appropriate positioning.  IMPRESSION: Successful ultrasound and fluoroscopic guided  placement of a bilateral sided 10 [place] PCNs.   Electronically Signed   By: Sandi Mariscal M.D.   On: 09/21/2014 14:59    Labs:  CBC:  Recent Labs  09/20/14 1440 09/21/14 0750  WBC 7.1 6.5  HGB 10.0* 9.9*  HCT 31.5* 31.2*  PLT 243  243    COAGS:  Recent Labs  09/21/14 0750  INR 1.05  APTT 33    BMP:  Recent Labs  09/20/14 1440 09/21/14 0750 09/22/14 0405  NA 146 143 142  K 5.0 3.7 4.0  CL 103 99 100  CO2 24 23 23   GLUCOSE 131* 123* 111*  BUN 110* 113* 110*  CALCIUM 9.7 9.6 9.4  CREATININE 5.85* 5.96* 5.93*  GFRNONAA 8* 7* 7*  GFRAA 9* 9* 9*   Assessment and Plan: Posterior bladder wall mass scheduled Monday for tumor resection Bilateral hydronephrosis Worsening renal insufficiency s/p B/L PCN in IR 12/2, Cr stable, good output B/L, R bloody output. Plans per Urology, possible discharge today per patient.    I spent a total of 15 minutes face to face in clinical consultation/evaluation, greater than 50% of which was counseling/coordinating care   Signed: Hedy Jacob 09/22/2014, 1:30 PM

## 2014-09-22 NOTE — Progress Notes (Signed)
Information provided to patient and patient wife regarding nephrostomy care and drainage.

## 2014-09-22 NOTE — Progress Notes (Signed)
Pt discharged home with wife and daughter/ Family and p tverbalized understanding of discharge instructions. No further questions at this time. Dressing and neph tubes in place.

## 2014-09-25 MED ORDER — CIPROFLOXACIN IN D5W 200 MG/100ML IV SOLN
200.0000 mg | INTRAVENOUS | Status: AC
  Administered 2014-09-26: 200 mg via INTRAVENOUS
  Filled 2014-09-25 (×2): qty 100

## 2014-09-25 NOTE — Anesthesia Preprocedure Evaluation (Addendum)
Anesthesia Evaluation  Patient identified by MRN, date of birth, ID band Patient awake    Reviewed: Allergy & Precautions, H&P , NPO status , Patient's Chart, lab work & pertinent test results  Airway Mallampati: II  TM Distance: >3 FB Neck ROM: Full    Dental no notable dental hx.    Pulmonary neg pulmonary ROS, former smoker,  breath sounds clear to auscultation  Pulmonary exam normal       Cardiovascular hypertension, Pt. on medications + angina + CAD, + CABG and + Peripheral Vascular Disease + dysrhythmias Atrial Fibrillation Rhythm:Regular Rate:Normal     Neuro/Psych PSYCHIATRIC DISORDERS Depression TIA Neuromuscular disease    GI/Hepatic negative GI ROS, Neg liver ROS,   Endo/Other  negative endocrine ROS  Renal/GU CRFRenal disease     Musculoskeletal negative musculoskeletal ROS (+)   Abdominal   Peds  Hematology negative hematology ROS (+) anemia ,   Anesthesia Other Findings   Reproductive/Obstetrics                            Anesthesia Physical Anesthesia Plan  ASA: IV  Anesthesia Plan: General   Post-op Pain Management:    Induction: Intravenous  Airway Management Planned: LMA  Additional Equipment:   Intra-op Plan:   Post-operative Plan: Extubation in OR  Informed Consent: I have reviewed the patients History and Physical, chart, labs and discussed the procedure including the risks, benefits and alternatives for the proposed anesthesia with the patient or authorized representative who has indicated his/her understanding and acceptance.   Dental advisory given  Plan Discussed with: CRNA  Anesthesia Plan Comments:         Anesthesia Quick Evaluation

## 2014-09-26 ENCOUNTER — Ambulatory Visit (HOSPITAL_COMMUNITY): Payer: Medicare Other | Admitting: Anesthesiology

## 2014-09-26 ENCOUNTER — Encounter (HOSPITAL_COMMUNITY): Payer: Self-pay | Admitting: *Deleted

## 2014-09-26 ENCOUNTER — Observation Stay (HOSPITAL_COMMUNITY)
Admission: RE | Admit: 2014-09-26 | Discharge: 2014-09-27 | Disposition: A | Discharge status: Home / Self Care | Payer: Medicare Other | Source: Ambulatory Visit | Attending: Urology | Admitting: Urology

## 2014-09-26 ENCOUNTER — Encounter (HOSPITAL_COMMUNITY)
Admission: RE | Disposition: A | Discharge status: Home / Self Care | Payer: Self-pay | Source: Ambulatory Visit | Attending: Urology

## 2014-09-26 DIAGNOSIS — Z8546 Personal history of malignant neoplasm of prostate: Secondary | ICD-10-CM | POA: Insufficient documentation

## 2014-09-26 DIAGNOSIS — I739 Peripheral vascular disease, unspecified: Secondary | ICD-10-CM | POA: Insufficient documentation

## 2014-09-26 DIAGNOSIS — Z923 Personal history of irradiation: Secondary | ICD-10-CM | POA: Diagnosis not present

## 2014-09-26 DIAGNOSIS — Z884 Allergy status to anesthetic agent status: Secondary | ICD-10-CM | POA: Insufficient documentation

## 2014-09-26 DIAGNOSIS — G629 Polyneuropathy, unspecified: Secondary | ICD-10-CM | POA: Insufficient documentation

## 2014-09-26 DIAGNOSIS — Z8673 Personal history of transient ischemic attack (TIA), and cerebral infarction without residual deficits: Secondary | ICD-10-CM | POA: Insufficient documentation

## 2014-09-26 DIAGNOSIS — I2510 Atherosclerotic heart disease of native coronary artery without angina pectoris: Secondary | ICD-10-CM | POA: Insufficient documentation

## 2014-09-26 DIAGNOSIS — R600 Localized edema: Secondary | ICD-10-CM | POA: Diagnosis not present

## 2014-09-26 DIAGNOSIS — C679 Malignant neoplasm of bladder, unspecified: Secondary | ICD-10-CM | POA: Diagnosis not present

## 2014-09-26 DIAGNOSIS — I4891 Unspecified atrial fibrillation: Secondary | ICD-10-CM | POA: Insufficient documentation

## 2014-09-26 DIAGNOSIS — F329 Major depressive disorder, single episode, unspecified: Secondary | ICD-10-CM | POA: Diagnosis not present

## 2014-09-26 DIAGNOSIS — N289 Disorder of kidney and ureter, unspecified: Secondary | ICD-10-CM | POA: Diagnosis not present

## 2014-09-26 DIAGNOSIS — N400 Enlarged prostate without lower urinary tract symptoms: Secondary | ICD-10-CM | POA: Diagnosis not present

## 2014-09-26 DIAGNOSIS — Z951 Presence of aortocoronary bypass graft: Secondary | ICD-10-CM | POA: Diagnosis not present

## 2014-09-26 DIAGNOSIS — N184 Chronic kidney disease, stage 4 (severe): Secondary | ICD-10-CM | POA: Insufficient documentation

## 2014-09-26 DIAGNOSIS — N2581 Secondary hyperparathyroidism of renal origin: Secondary | ICD-10-CM | POA: Diagnosis not present

## 2014-09-26 DIAGNOSIS — N1330 Unspecified hydronephrosis: Secondary | ICD-10-CM | POA: Diagnosis present

## 2014-09-26 DIAGNOSIS — D649 Anemia, unspecified: Secondary | ICD-10-CM | POA: Insufficient documentation

## 2014-09-26 DIAGNOSIS — I129 Hypertensive chronic kidney disease with stage 1 through stage 4 chronic kidney disease, or unspecified chronic kidney disease: Secondary | ICD-10-CM | POA: Insufficient documentation

## 2014-09-26 DIAGNOSIS — Z881 Allergy status to other antibiotic agents status: Secondary | ICD-10-CM | POA: Insufficient documentation

## 2014-09-26 DIAGNOSIS — D494 Neoplasm of unspecified behavior of bladder: Secondary | ICD-10-CM | POA: Diagnosis present

## 2014-09-26 DIAGNOSIS — N359 Urethral stricture, unspecified: Secondary | ICD-10-CM | POA: Insufficient documentation

## 2014-09-26 DIAGNOSIS — Z87891 Personal history of nicotine dependence: Secondary | ICD-10-CM | POA: Diagnosis not present

## 2014-09-26 HISTORY — PX: TRANSURETHRAL RESECTION OF BLADDER TUMOR WITH GYRUS (TURBT-GYRUS): SHX6458

## 2014-09-26 SURGERY — TRANSURETHRAL RESECTION OF BLADDER TUMOR WITH GYRUS (TURBT-GYRUS)
Anesthesia: General

## 2014-09-26 MED ORDER — LIDOCAINE 5 % EX PTCH
1.0000 | MEDICATED_PATCH | Freq: Two times a day (BID) | CUTANEOUS | Status: DC | PRN
  Filled 2014-09-26: qty 1

## 2014-09-26 MED ORDER — FENTANYL CITRATE 0.05 MG/ML IJ SOLN
INTRAMUSCULAR | Status: DC | PRN
  Administered 2014-09-26: 25 ug via INTRAVENOUS

## 2014-09-26 MED ORDER — GABAPENTIN 100 MG PO CAPS
100.0000 mg | ORAL_CAPSULE | Freq: Every morning | ORAL | Status: DC
  Administered 2014-09-27: 100 mg via ORAL
  Filled 2014-09-26: qty 1

## 2014-09-26 MED ORDER — HYDROMORPHONE HCL 1 MG/ML IJ SOLN: 0.2500 mg | INTRAMUSCULAR | Status: DC | PRN

## 2014-09-26 MED ORDER — POLYSACCHARIDE IRON COMPLEX 150 MG PO CAPS
150.0000 mg | ORAL_CAPSULE | Freq: Two times a day (BID) | ORAL | Status: DC
  Administered 2014-09-26 – 2014-09-27 (×2): 150 mg via ORAL
  Filled 2014-09-26 (×3): qty 1

## 2014-09-26 MED ORDER — LIDOCAINE HCL (CARDIAC) 20 MG/ML IV SOLN
INTRAVENOUS | Status: DC | PRN
  Administered 2014-09-26: 40 mg via INTRAVENOUS

## 2014-09-26 MED ORDER — LIDOCAINE HCL 2 % EX GEL
CUTANEOUS | Status: AC
  Filled 2014-09-26: qty 10

## 2014-09-26 MED ORDER — LIDOCAINE HCL (CARDIAC) 20 MG/ML IV SOLN
INTRAVENOUS | Status: AC
  Filled 2014-09-26: qty 5

## 2014-09-26 MED ORDER — FENTANYL CITRATE 0.05 MG/ML IJ SOLN
INTRAMUSCULAR | Status: AC
  Filled 2014-09-26: qty 2

## 2014-09-26 MED ORDER — SODIUM CHLORIDE 0.9 % IR SOLN
Status: DC | PRN
  Administered 2014-09-26: 3000 mL via INTRAVESICAL

## 2014-09-26 MED ORDER — PROMETHAZINE HCL 25 MG/ML IJ SOLN: 6.2500 mg | INTRAMUSCULAR | Status: DC | PRN

## 2014-09-26 MED ORDER — PROPOFOL 10 MG/ML IV BOLUS
INTRAVENOUS | Status: AC
  Filled 2014-09-26: qty 20

## 2014-09-26 MED ORDER — ONDANSETRON HCL 4 MG/2ML IJ SOLN: 4.0000 mg | INTRAMUSCULAR | Status: DC | PRN

## 2014-09-26 MED ORDER — FUROSEMIDE 40 MG PO TABS
80.0000 mg | ORAL_TABLET | Freq: Two times a day (BID) | ORAL | Status: DC
  Administered 2014-09-26 – 2014-09-27 (×2): 80 mg via ORAL
  Filled 2014-09-26 (×2): qty 2

## 2014-09-26 MED ORDER — NIFEDIPINE ER 30 MG PO TB24
30.0000 mg | ORAL_TABLET | Freq: Two times a day (BID) | ORAL | Status: DC
  Administered 2014-09-26 – 2014-09-27 (×2): 30 mg via ORAL
  Filled 2014-09-26 (×3): qty 1

## 2014-09-26 MED ORDER — SIMVASTATIN 40 MG PO TABS
40.0000 mg | ORAL_TABLET | Freq: Every evening | ORAL | Status: DC
  Administered 2014-09-26: 40 mg via ORAL
  Filled 2014-09-26 (×2): qty 1

## 2014-09-26 MED ORDER — EPHEDRINE SULFATE 50 MG/ML IJ SOLN
INTRAMUSCULAR | Status: AC
  Filled 2014-09-26: qty 1

## 2014-09-26 MED ORDER — SODIUM CHLORIDE 0.9 % IJ SOLN
INTRAMUSCULAR | Status: AC
  Filled 2014-09-26: qty 10

## 2014-09-26 MED ORDER — OXYCODONE HCL 5 MG PO TABS: 5.0000 mg | ORAL_TABLET | Freq: Once | ORAL | Status: DC | PRN

## 2014-09-26 MED ORDER — METHADONE HCL 5 MG PO TABS
5.0000 mg | ORAL_TABLET | Freq: Four times a day (QID) | ORAL | Status: DC
  Administered 2014-09-26 – 2014-09-27 (×4): 5 mg via ORAL
  Filled 2014-09-26 (×4): qty 1

## 2014-09-26 MED ORDER — SODIUM CHLORIDE 0.45 % IV SOLN
INTRAVENOUS | Status: DC
  Administered 2014-09-26: 1000 mL via INTRAVENOUS

## 2014-09-26 MED ORDER — ONDANSETRON HCL 4 MG/2ML IJ SOLN
INTRAMUSCULAR | Status: DC | PRN
  Administered 2014-09-26: 4 mg via INTRAVENOUS

## 2014-09-26 MED ORDER — OXYBUTYNIN CHLORIDE 5 MG PO TABS: 5.0000 mg | ORAL_TABLET | Freq: Three times a day (TID) | ORAL | Status: DC | PRN

## 2014-09-26 MED ORDER — ONDANSETRON HCL 4 MG/2ML IJ SOLN
INTRAMUSCULAR | Status: AC
  Filled 2014-09-26: qty 2

## 2014-09-26 MED ORDER — ACETAMINOPHEN 325 MG PO TABS: 650.0000 mg | ORAL_TABLET | ORAL | Status: DC | PRN

## 2014-09-26 MED ORDER — HYDROCODONE-ACETAMINOPHEN 5-325 MG PO TABS
1.0000 | ORAL_TABLET | ORAL | Status: DC | PRN
  Administered 2014-09-26 (×3): 2 via ORAL
  Administered 2014-09-27 (×2): 1 via ORAL
  Filled 2014-09-26: qty 2
  Filled 2014-09-26: qty 1
  Filled 2014-09-26: qty 2
  Filled 2014-09-26: qty 1
  Filled 2014-09-26: qty 2

## 2014-09-26 MED ORDER — MEPERIDINE HCL 50 MG/ML IJ SOLN: 6.2500 mg | INTRAMUSCULAR | Status: DC | PRN

## 2014-09-26 MED ORDER — OXYCODONE HCL 5 MG/5ML PO SOLN: 5.0000 mg | Freq: Once | ORAL | Status: DC | PRN

## 2014-09-26 MED ORDER — STERILE WATER FOR IRRIGATION IR SOLN
Status: DC | PRN
  Administered 2014-09-26: 3000 mL via INTRAVESICAL

## 2014-09-26 MED ORDER — SODIUM CHLORIDE 0.9 % IV SOLN
INTRAVENOUS | Status: DC | PRN
  Administered 2014-09-26: 08:00:00 via INTRAVENOUS

## 2014-09-26 MED ORDER — PROPOFOL 10 MG/ML IV BOLUS
INTRAVENOUS | Status: DC | PRN
Start: 2014-09-26 — End: 2014-09-26
  Administered 2014-09-26: 100 mg via INTRAVENOUS

## 2014-09-26 MED ORDER — OXYBUTYNIN CHLORIDE 5 MG PO TABS
5.0000 mg | ORAL_TABLET | Freq: Three times a day (TID) | ORAL | Status: DC | PRN
  Filled 2014-09-26: qty 1

## 2014-09-26 MED ORDER — DOCUSATE SODIUM 100 MG PO CAPS
100.0000 mg | ORAL_CAPSULE | Freq: Two times a day (BID) | ORAL | Status: DC
  Administered 2014-09-26 – 2014-09-27 (×2): 100 mg via ORAL
  Filled 2014-09-26 (×3): qty 1

## 2014-09-26 SURGICAL SUPPLY — 22 items
BAG URINE DRAINAGE (UROLOGICAL SUPPLIES) IMPLANT
BAG URO CATCHER STRL LF (DRAPE) ×4 IMPLANT
CATH FOLEY 2WAY SLVR  5CC 20FR (CATHETERS) ×2
CATH FOLEY 2WAY SLVR 5CC 20FR (CATHETERS) ×1 IMPLANT
CATH INTERMIT  6FR 70CM (CATHETERS) ×3 IMPLANT
CLOTH BEACON ORANGE TIMEOUT ST (SAFETY) ×4 IMPLANT
DRAPE CAMERA CLOSED 9X96 (DRAPES) ×4 IMPLANT
ELECT REM PT RETURN 9FT ADLT (ELECTROSURGICAL)
ELECTRODE REM PT RTRN 9FT ADLT (ELECTROSURGICAL) ×1 IMPLANT
EVACUATOR MICROVAS BLADDER (UROLOGICAL SUPPLIES) IMPLANT
GLOVE BIOGEL M 8.0 STRL (GLOVE) ×16 IMPLANT
GOWN STRL REUS W/ TWL XL LVL3 (GOWN DISPOSABLE) ×2 IMPLANT
GOWN STRL REUS W/TWL XL LVL3 (GOWN DISPOSABLE) ×11 IMPLANT
GUIDEWIRE STR DUAL SENSOR (WIRE) ×3 IMPLANT
KIT ASPIRATION TUBING (SET/KITS/TRAYS/PACK) ×3 IMPLANT
LOOPS RESECTOSCOPE DISP (ELECTROSURGICAL) ×1 IMPLANT
MANIFOLD NEPTUNE II (INSTRUMENTS) ×4 IMPLANT
PACK CYSTO (CUSTOM PROCEDURE TRAY) ×4 IMPLANT
SYRINGE IRR TOOMEY STRL 70CC (SYRINGE) ×3 IMPLANT
TUBING CONNECTING 10 (TUBING) ×3 IMPLANT
TUBING CONNECTING 10' (TUBING) ×1
WATER STERILE IRR 3000ML UROMA (IV SOLUTION) ×1 IMPLANT

## 2014-09-26 NOTE — Discharge Instructions (Signed)

## 2014-09-26 NOTE — Plan of Care (Signed)
Problem: Phase I Progression Outcomes Goal: Pain controlled with appropriate interventions Outcome: Completed/Met Date Met:  09/26/14 Goal: Tubes/drains patent Outcome: Completed/Met Date Met:  09/26/14 Goal: Voiding-avoid urinary catheter unless indicated Outcome: Not Applicable Date Met:  99/69/24

## 2014-09-26 NOTE — Anesthesia Postprocedure Evaluation (Signed)
Anesthesia Post Note  Patient: Benjamin Macdonald  Procedure(s) Performed: Procedure(s) (LRB): TRANSURETHRAL RESECTION OF BLADDER  WITH GYRUS (TURBT-GYRUS), bladder biopsy (N/A)  Anesthesia type: General  Patient location: PACU  Post pain: Pain level controlled  Post assessment: Post-op Vital signs reviewed  Last Vitals: BP 173/77 mmHg  Pulse 84  Temp(Src) 36.3 C (Oral)  Resp 16  Ht 5\' 4"  (1.626 m)  Wt 126 lb (57.153 kg)  BMI 21.62 kg/m2  SpO2 100%  Post vital signs: Reviewed  Level of consciousness: sedated  Complications: No apparent anesthesia complications

## 2014-09-26 NOTE — Transfer of Care (Signed)
Immediate Anesthesia Transfer of Care Note  Patient: Benjamin Macdonald  Procedure(s) Performed: Procedure(s): TRANSURETHRAL RESECTION OF BLADDER  WITH GYRUS (TURBT-GYRUS), bladder biopsy (N/A)  Patient Location: PACU  Anesthesia Type:General  Level of Consciousness: awake  Airway & Oxygen Therapy: Patient Spontanous Breathing and Patient connected to face mask oxygen  Post-op Assessment: Report given to PACU RN and Post -op Vital signs reviewed and stable  Post vital signs: Reviewed and stable  Complications: No apparent anesthesia complications

## 2014-09-26 NOTE — Op Note (Signed)
Preoperative diagnosis:bladder mass with bilateral hydronephrosis, urethral strictures  Postoperative diagnosis:same , urethral strictures  Procedure:cystoscopy, bladder biopsy  , dilation of urethra to 28 French with Leander Rams sounds  Surgeon: Lillette Boxer. Avryl Roehm, M.D.   Anesthesia: Gen.   Complications:none  Specimen(s):biopsies of bladder mass  Drain(s):20 [place] Foley catheter, to bedside bag  Indications:80 year old male, status post prior TURP and subsequent radiotherapy for prostate cancer when his cancer was found incidentally in the prostate chips. That was performed years ago. The patient has had worsening renal insufficiency, and has been found to have bilateral hydroureteronephrosis down to the bladder. Recent cystoscopy revealed bladder wall thickening/mass. Because of his worsening renal insufficiency, he recently had bilateral percutaneous nephrostomy tubes placed. He presents at this time for cystoscopy, attempted retrograde ureteropyelograms and possible double-J stent placement bilaterally, and biopsy of bladder. Procedure has been discussed with the patient and his family who understand and desire to proceed.    Technique and findings:the patient was properly identified in the holding area. He received 200 mg of IV Cipro. He was taken the operating room where general anesthetic was administered with the LMA. He was placed in the dorsolithotomy position. Genitalia and perineum were prepped and draped. Timeout was then performed. I attempted to pass a 24 French panendoscope through his urethra. The urethra would not allow this, and I then dilated his urethra to 74 [place] with [employer].following this, the scope passed through the urethra. There were minimal short strictures easily passed with the scope. Prostatic urethra was open. The prostate was quite fixed due to prior radiation, making it difficult to circumferentially inspect the bladder with the 12 lens. I then  placed the 70 lens. The bladder was inspected. Because of significant urothelial abnormality found generally throughout the bladder, I was unable to find the ureteral orifice sees. There was a whitish, exudative process throughout the bladder. This could have been necrotic tissue, versus fungal infection. There was very little normal urothelium in the bladder. There were no papillary lesions. Following inspection the trigone for a decent length of time without the ability to identify the ureteral orifices, I then decided that stent placement would be impossible.  I then passedthe resectoscope using the visual obturator. tTe bladder wall was again visualized. I never did see significant amounts of normal urothelium. This whitish tissue and slightly raised tissue was then resected using the loop. I resected enough tissue to provide enough sample for pathologic analysis. There was no significant bleeding from the resected tissue. The chips were then irrigated from the bladder. It was noted during irrigation that the bladder volume was quite small, perhaps 30 mL. The tissue was passed off and it pathology timeout was performed.    I passed a 63 [place] Foley catheter.this was hooked to dependent drainage. The patient was awakened and taken to the PACU in stable condition.

## 2014-09-26 NOTE — H&P (Signed)
Urology History and Physical Exam  CC: Bladder mass  HPI: 80  year old male presents for TUR-BT/bladder biopsy of a newly found bladder mass. He received bilateral percutaneous nephrostomy tubes last week for a rising creatinine--up to > 5.0. He has a h/o PCA rx'ed with XRT and has had an undetectable PSA.  PMH: Past Medical History  Diagnosis Date  . CKD (chronic kidney disease), stage IV   . Hypertension   . Neuropathy   . Cancer 2002    prostate sp radiation  . Dehydration   . Weight loss   . Failure to thrive in childhood     In Adult  . Anemia   . History of TIAs   . Coronary artery disease     CABG May of 1991 six bypass  . Anginal pain     hx of  prior to CABG  . Stroke     hx of TIA's times 3  . BPH (benign prostatic hyperplasia)   . Lower extremity edema   . Cellulitis of right leg     hx of treated with clindamycin   . Neuromuscular disorder     peripheral neuropathy  . Dysrhythmia     hx of atrial arrhythymia per [facility] history 09/06/2014   . Chronic pain   . Urinary incontinence   . Hematuria     hx of per [facility] history 09/06/2014  . Depression     hx of per [facility] history 09/06/2014   . Secondary hyperparathyroidism     per [facility] history 09/06/2014   . Rectal incontinence     hx of per [facility] history 09/06/2014   . Hearing loss   . Arteriovenous fistula     left brachiocephalic     PSH: Past Surgical History  Procedure Laterality Date  . Coronary artery bypass graft      1992  . Dg av dialysis  shunt access exist*l* or      left arm   . Total hip arthroplasty      right   . Transurethral resection of prostate      1989; [facility] 4-5/10 external beam radiotherapy completed 03/14/1989  . Tonsillectomy    . Colonscopy       polyps removed  . Eye surgery      cataract removed per left eye   . Hernia repair      inguinal bilateral     Allergies: Allergies  Allergen Reactions  . Aspirin  Swelling    Swelling of the face   . Ciprofloxacin Diarrhea  . Doxycycline Diarrhea and Nausea And Vomiting  . Amoxicillin Rash  . Cephalexin Rash    Medications: Prescriptions prior to admission  Medication Sig Dispense Refill Last Dose  . furosemide (LASIX) 80 MG tablet Take 80 mg by mouth 2 (two) times daily.    09/21/2014 at 0600  . gabapentin (NEURONTIN) 100 MG capsule Take 100 mg by mouth every morning.    09/20/2014 at Unknown time  . lidocaine (LIDODERM) 5 % Place 1 patch onto the skin every 12 (twelve) hours as needed (for pain). Remove & Discard patch within 12 hours or as directed by MD   unknown  . methadone (DOLOPHINE) 5 MG tablet Take 5 mg by mouth every 6 (six) hours.    09/20/2014 at 0600  . Multiple Vitamins-Minerals (MULTIVITAMINS THER. W/MINERALS) TABS Take 1 tablet by mouth every morning.    09/17/2014 at Unknown time  . NIFEdipine (  PROCARDIA-XL/ADALAT CC) 30 MG 24 hr tablet Take 30 mg by mouth 2 (two) times daily.   09/21/2014 at 0600  . simvastatin (ZOCOR) 40 MG tablet Take 40 mg by mouth every evening.   09/20/2014 at Unknown time  . iron polysaccharides (NIFEREX) 150 MG capsule Take 150 mg by mouth 2 (two) times daily.   09/20/2014 at Unknown time     Social History: History   Social History  . Marital Status: Married    Spouse Name: N/A    Number of Children: 3  . Years of Education: N/A   Occupational History  . printer    Social History Main Topics  . Smoking status: Former Research scientist (life sciences)  . Smokeless tobacco: Never Used  . Alcohol Use: No  . Drug Use: No  . Sexual Activity: Not on file   Other Topics Concern  . Not on file   Social History Narrative    Family History: Family History  Problem Relation Age of Onset  . Heart disease Mother   . Heart disease Brother     Review of Systems: Positive: Blood in urine since PCN tubes placed Negative:   A further 10 point review of systems was negative except what is listed in the HPI.                   Physical Exam: @VITALS2 @ General: No acute distress.  Awake. Head:  Normocephalic.  Atraumatic. ENT:  EOMI.  Mucous membranes moist Neck:  Supple.  No lymphadenopathy. CV:  S1 present. S2 present. Regular rate. Pulmonary: Equal effort bilaterally.  Clear to auscultation bilaterally. Abdomen: Soft.  Non tender to palpation. Perc tubes placed in both flanks. Skin:  Normal turgor.  No visible rash. Extremity: No gross deformity of bilateral upper extremities.  No gross deformity of                             lower extremities. Neurologic: Alert. Appropriate mood.     Studies:  No results for input(s): HGB, WBC, PLT in the last 72 hours.  No results for input(s): NA, K, CL, CO2, BUN, CREATININE, CALCIUM, GFRNONAA, GFRAA in the last 72 hours.  Invalid input(s): MAGNESIUM   No results for input(s): INR, APTT in the last 72 hours.  Invalid input(s): PT   Invalid input(s): ABG    Assessment:  Bladder mass w/ bilateral hydronephrosis, s/p bilateral percutaneous tube placement  Plan: Cystoscopy, TUR-BT, attempted bilateral stent placement

## 2014-09-27 ENCOUNTER — Encounter (HOSPITAL_COMMUNITY): Payer: Self-pay | Admitting: Urology

## 2014-09-27 DIAGNOSIS — C679 Malignant neoplasm of bladder, unspecified: Secondary | ICD-10-CM | POA: Diagnosis not present

## 2014-09-27 LAB — BASIC METABOLIC PANEL
ANION GAP: 15 (ref 5–15)
BUN: 87 mg/dL — ABNORMAL HIGH (ref 6–23)
CHLORIDE: 104 meq/L (ref 96–112)
CO2: 21 meq/L (ref 19–32)
Calcium: 8.4 mg/dL (ref 8.4–10.5)
Creatinine, Ser: 4.61 mg/dL — ABNORMAL HIGH (ref 0.50–1.35)
GFR calc Af Amer: 12 mL/min — ABNORMAL LOW (ref >90)
GFR calc non Af Amer: 10 mL/min — ABNORMAL LOW (ref >90)
Glucose, Bld: 110 mg/dL — ABNORMAL HIGH (ref 70–99)
POTASSIUM: 4.1 meq/L (ref 3.7–5.3)
SODIUM: 140 meq/L (ref 137–147)

## 2014-09-27 NOTE — Progress Notes (Signed)
UR completed 

## 2014-09-27 NOTE — Progress Notes (Signed)
Patient ID: Benjamin Macdonald, male   DOB: Jul 25, 1921, 80 y.o.   MRN: [id number]  1 Day Post-Op Subjective: Pt doing well.  Catheter removed this morning.  Objective: Vital signs in last 24 hours: Temp:  [97.3 F (36.3 C)-98.1 F (36.7 C)] 98.1 F (36.7 C) (12/08 0538) Pulse Rate:  [67-88] 86 (12/08 0538) Resp:  [12-18] 18 (12/08 0538) BP: (118-173)/(51-88) 145/71 mmHg (12/08 0538) SpO2:  [94 %-100 %] 94 % (12/08 0538)  Intake/Output from previous day: 12/07 0701 - 12/08 0700 In: 1322.5 [P.O.:360; I.V.:962.5] Out: 1625 [Urine:1625] Intake/Output this shift:    Physical Exam:  General: Alert and oriented Abd: Bilateral PCN tubes draining grossly clear urine GU: No active urethral bleeding  Lab Results: No results for input(s): HGB, HCT in the last 72 hours. BMET  Recent Labs  09/27/14 0358  NA 140  K 4.1  CL 104  CO2 21  GLUCOSE 110*  BUN 87*  CREATININE 4.61*  CALCIUM 8.4   Cr improved from 5.93 on 12/3  Studies/Results: No results found.  Assessment/Plan: - Discharge home   LOS: 1 day   [name],LES 09/27/2014, 7:16 AM

## 2014-10-03 NOTE — Discharge Summary (Signed)
Patient ID: Benjamin Macdonald MRN: [id number] DOB/AGE: [birth year] 80 y.o.  Admit date: 09/26/2014 Discharge date: 10/03/2014  Primary Care Physician:   Melinda Crutch, MD  Discharge Diagnoses:   Present on Admission:  . Bladder tumor Bilateral hydronephrosis Acute and chronic renal insufficiency  Consults:  None     Discharge Medications:   Medication List    TAKE these medications        furosemide 80 MG tablet  Commonly known as:  LASIX  Take 80 mg by mouth 2 (two) times daily.     gabapentin 100 MG capsule  Commonly known as:  NEURONTIN  Take 100 mg by mouth every morning.     iron polysaccharides 150 MG capsule  Commonly known as:  NIFEREX  Take 150 mg by mouth 2 (two) times daily.     lidocaine 5 %  Commonly known as:  LIDODERM  Place 1 patch onto the skin every 12 (twelve) hours as needed (for pain). Remove & Discard patch within 12 hours or as directed by MD     methadone 5 MG tablet  Commonly known as:  DOLOPHINE  Take 5 mg by mouth every 6 (six) hours.     multivitamins ther. w/minerals Tabs tablet  Take 1 tablet by mouth every morning.     NIFEdipine 30 MG 24 hr tablet  Commonly known as:  PROCARDIA-XL/ADALAT CC  Take 30 mg by mouth 2 (two) times daily.     oxybutynin 5 MG tablet  Commonly known as:  DITROPAN  Take 1 tablet (5 mg total) by mouth every 8 (eight) hours as needed for bladder spasms.     simvastatin 40 MG tablet  Commonly known as:  ZOCOR  Take 40 mg by mouth every evening.         Significant Diagnostic Studies:  Ct Abdomen Pelvis Wo Contrast  09/21/2014   CLINICAL DATA:  History of bladder mass, now with bilateral hydronephrosis. Please perform noncontrast abdominal CT to assist with staging and for planning purposes prior to potential bilateral percutaneous nephrostomy tube placement.  EXAM: CT ABDOMEN AND PELVIS WITHOUT CONTRAST  TECHNIQUE: Multidetector CT imaging of the abdomen and pelvis was performed following the standard  protocol without IV contrast.  COMPARISON:  Renal ultrasound - 09/07/2014  FINDINGS: The lack of intravenous contrast limits the ability to evaluate solid abdominal organs.  Normal hepatic contour. Punctate (approximately 0.4 cm) radiopaque stone lying dependently within an otherwise normal-appearing gallbladder. Potential periportal edema is suspected on this noncontrast examination. No ascites.  There is severe right and moderate left-sided pelvicaliectasis and ureterectasis to the level of the urinary bladder, though note, evaluation of the distal aspect of the bilateral ureters is degraded secondary to lack of mesenteric fact as well as beam hardening artifact from the patient's right total hip replacement. No renal stones.  There is diffuse though slightly asymmetric urinary bladder wall thickening, left greater than right. No definitive bulky pelvic or inguinal lymphadenopathy on this noncontrast examination.  There is mild diffuse thickening of the bilateral adrenal glands without discrete nodule. The pancreas appears atrophic. Normal appearance of the spleen. Incidental note is made of a small splenule.  Moderate colonic stool burden without evidence of enteric obstruction. The bowel is normal in course and caliber without wall thickening or evidence of obstruction. The appendix is not visualized however there is no inflammatory change within the right lower abdominal quadrant. No pneumoperitoneum, pneumatosis or portal venous gas.  Moderate to large amount of slightly irregular  calcified atherosclerotic plaque within a tortuous but normal caliber abdominal aorta. Calcified atherosclerotic plaque projects into the lumen of the mid aspect of the infrarenal abdominal aorta (represent axial image 23, series 2, coronal image 32, series 602). This finding is without associated periaortic stranding.  No definitive bulky retroperitoneal or mesenteric adenopathy on this noncontrast examination.  Limited  visualization of the lower thorax demonstrates minimal consolidative opacities within the right lower lobe, the cranial extent of which is not imaged on this examination. There is minimal subsegmental atelectasis within the caudal segments of the right middle lobe and lingula. No pleural effusion.  Cardiomegaly. Calcifications within the mitral valve annulus. No pericardial effusion.  Mild scoliotic curvature of the thoracolumbar spine with associated multilevel moderate to severe DDD. Post right total hip replacement, the caudal extent is not imaged without definitive evidence of hardware failure or loosening.  Patient appears cachectic. Regional soft tissues appear otherwise normal.  IMPRESSION: 1. Diffuse though slightly asymmetric thickening of the urinary bladder wall, left greater than right. The patient's known urinary bladder mass is not well demonstrated on this noncontrast examination possibly attributable to streak artifact from the patient's right total hip replacement. 2. Severe right and moderate left pelvicaliectasis and ureterectasis to the level of the urinary bladder presumably secondary to the patient's known bladder mass. 3. No definite evidence of metastatic disease to the abdomen or pelvis on this noncontrast examination. 4. Consolidative opacities within the imaged right lower lobe, the cranial extent of which is not imaged on this examination. Further evaluation with staging chest CT could be performed as clinically indicated. 5. Cholelithiasis without evidence of cholecystitis. 6. Suspected mild periportal edema on this noncontrast examination. Correlation with LFTs is recommended.   Electronically Signed   By: Sandi Mariscal M.D.   On: 09/21/2014 10:19   Ir Perc Nephrostomy Left  09/21/2014   INDICATION: History of bladder mass, now with bilateral obstructive uropathy an elevated creatinine. Please perform bilateral percutaneous nephrostomy catheter placement for renal preservation  purposes.  EXAM: 1. ULTRASOUND GUIDANCE FOR PUNCTURE OF THE BILATERAL RENAL COLLECTING SYSTEMS 2. BILATERAL PERCUTANEOUS NEPHROSTOMY TUBE PLACEMENT.  COMPARISON:  Renal ultrasound - 09/07/2014; CT abdomen pelvis - 09/21/2014  MEDICATIONS: Clindamycin 600 mg IV; The antibiotic was administered in an appropriate time frame prior to skin puncture.  ANESTHESIA/SEDATION: Fentanyl 50 mcg IV; Versed 1.5 mg IV  Total Moderate Sedation Time  50 minutes.  CONTRAST:  A total of 40 mL Isovue 300 was administered into both collecting systems  FLUOROSCOPY TIME:  5 minutes 6 seconds.  (48 mGy)  COMPLICATIONS: None immediate  PROCEDURE: The procedure, risks, benefits, and alternatives were explained to the patient. Questions regarding the procedure were encouraged and answered. The patient understands and consents to the procedure. A timeout was performed prior to the initiation of the procedure.  The bilateral flanks were prepped with Betadine in a sterile fashion, and a sterile drape was applied covering the operative field. A sterile gown and sterile gloves were used for the procedure. Local anesthesia was provided with 1% Lidocaine with epinephrine. Ultrasound was used to localize the left kidney. Under direct ultrasound guidance, a 21 gauge needle was advanced into the renal collecting system. An ultrasound image documentation was performed. Access within the collecting system was confirmed with the efflux of urine followed by contrast injection.  Over a Nitrex wire, the inner three [place] catheter of an Accustick set was advanced into the renal collecting system. Contrast injection was injected into the collecting system  as several spot radiographs were obtained in various obliquities confirming puncture within a posterior inferior calix. As such, the tract was dilated with an Accustick stent. Over a guide wire, a 10-French percutaneous nephrostomy catheter was advanced into the collecting system where the coil was formed and  locked. Contrast was injected and several sport radiographs were obtained in various obliquities confirming access.  The identical procedure was repeated for the contralateral right kidney ultimately allowing successful placement of a 10 French percutaneous nephrostomy catheter via a posterior inferior calyx.  Both catheters were secured at the skin exit sites with Prolene retention sutures and attached to gravity bags. Dressings were placed. The patient tolerated both procedures well without immediate postprocedural complication.  FINDINGS: Ultrasound scanning demonstrates moderate to severely dilated bilateral renal collecting systems.  Under direct ultrasound guidance, bilateral 10-French percutaneous nephrostomy catheters were placed via a posterior inferior calyx under intermittent fluoroscopic guidance. Contrast injection confirmed appropriate positioning.  IMPRESSION: Successful ultrasound and fluoroscopic guided placement of a bilateral sided 10 [place] PCNs.   Electronically Signed   By: Sandi Mariscal M.D.   On: 09/21/2014 14:59   Ir Perc Nephrostomy Right  09/21/2014   INDICATION: History of bladder mass, now with bilateral obstructive uropathy an elevated creatinine. Please perform bilateral percutaneous nephrostomy catheter placement for renal preservation purposes.  EXAM: 1. ULTRASOUND GUIDANCE FOR PUNCTURE OF THE BILATERAL RENAL COLLECTING SYSTEMS 2. BILATERAL PERCUTANEOUS NEPHROSTOMY TUBE PLACEMENT.  COMPARISON:  Renal ultrasound - 09/07/2014; CT abdomen pelvis - 09/21/2014  MEDICATIONS: Clindamycin 600 mg IV; The antibiotic was administered in an appropriate time frame prior to skin puncture.  ANESTHESIA/SEDATION: Fentanyl 50 mcg IV; Versed 1.5 mg IV  Total Moderate Sedation Time  50 minutes.  CONTRAST:  A total of 40 mL Isovue 300 was administered into both collecting systems  FLUOROSCOPY TIME:  5 minutes 6 seconds.  (48 mGy)  COMPLICATIONS: None immediate  PROCEDURE: The procedure, risks, benefits,  and alternatives were explained to the patient. Questions regarding the procedure were encouraged and answered. The patient understands and consents to the procedure. A timeout was performed prior to the initiation of the procedure.  The bilateral flanks were prepped with Betadine in a sterile fashion, and a sterile drape was applied covering the operative field. A sterile gown and sterile gloves were used for the procedure. Local anesthesia was provided with 1% Lidocaine with epinephrine. Ultrasound was used to localize the left kidney. Under direct ultrasound guidance, a 21 gauge needle was advanced into the renal collecting system. An ultrasound image documentation was performed. Access within the collecting system was confirmed with the efflux of urine followed by contrast injection.  Over a Nitrex wire, the inner three [place] catheter of an Accustick set was advanced into the renal collecting system. Contrast injection was injected into the collecting system as several spot radiographs were obtained in various obliquities confirming puncture within a posterior inferior calix. As such, the tract was dilated with an Accustick stent. Over a guide wire, a 10-French percutaneous nephrostomy catheter was advanced into the collecting system where the coil was formed and locked. Contrast was injected and several sport radiographs were obtained in various obliquities confirming access.  The identical procedure was repeated for the contralateral right kidney ultimately allowing successful placement of a 10 French percutaneous nephrostomy catheter via a posterior inferior calyx.  Both catheters were secured at the skin exit sites with Prolene retention sutures and attached to gravity bags. Dressings were placed. The patient tolerated both procedures well without  immediate postprocedural complication.  FINDINGS: Ultrasound scanning demonstrates moderate to severely dilated bilateral renal collecting systems.  Under direct  ultrasound guidance, bilateral 10-French percutaneous nephrostomy catheters were placed via a posterior inferior calyx under intermittent fluoroscopic guidance. Contrast injection confirmed appropriate positioning.  IMPRESSION: Successful ultrasound and fluoroscopic guided placement of a bilateral sided 10 [place] PCNs.   Electronically Signed   By: Sandi Mariscal M.D.   On: 09/21/2014 14:59   Ir US Guide Bx Asp/drain  09/21/2014   INDICATION: History of bladder mass, now with bilateral obstructive uropathy an elevated creatinine. Please perform bilateral percutaneous nephrostomy catheter placement for renal preservation purposes.  EXAM: 1. ULTRASOUND GUIDANCE FOR PUNCTURE OF THE BILATERAL RENAL COLLECTING SYSTEMS 2. BILATERAL PERCUTANEOUS NEPHROSTOMY TUBE PLACEMENT.  COMPARISON:  Renal ultrasound - 09/07/2014; CT abdomen pelvis - 09/21/2014  MEDICATIONS: Clindamycin 600 mg IV; The antibiotic was administered in an appropriate time frame prior to skin puncture.  ANESTHESIA/SEDATION: Fentanyl 50 mcg IV; Versed 1.5 mg IV  Total Moderate Sedation Time  50 minutes.  CONTRAST:  A total of 40 mL Isovue 300 was administered into both collecting systems  FLUOROSCOPY TIME:  5 minutes 6 seconds.  (48 mGy)  COMPLICATIONS: None immediate  PROCEDURE: The procedure, risks, benefits, and alternatives were explained to the patient. Questions regarding the procedure were encouraged and answered. The patient understands and consents to the procedure. A timeout was performed prior to the initiation of the procedure.  The bilateral flanks were prepped with Betadine in a sterile fashion, and a sterile drape was applied covering the operative field. A sterile gown and sterile gloves were used for the procedure. Local anesthesia was provided with 1% Lidocaine with epinephrine. Ultrasound was used to localize the left kidney. Under direct ultrasound guidance, a 21 gauge needle was advanced into the renal collecting system. An ultrasound  image documentation was performed. Access within the collecting system was confirmed with the efflux of urine followed by contrast injection.  Over a Nitrex wire, the inner three [place] catheter of an Accustick set was advanced into the renal collecting system. Contrast injection was injected into the collecting system as several spot radiographs were obtained in various obliquities confirming puncture within a posterior inferior calix. As such, the tract was dilated with an Accustick stent. Over a guide wire, a 10-French percutaneous nephrostomy catheter was advanced into the collecting system where the coil was formed and locked. Contrast was injected and several sport radiographs were obtained in various obliquities confirming access.  The identical procedure was repeated for the contralateral right kidney ultimately allowing successful placement of a 10 French percutaneous nephrostomy catheter via a posterior inferior calyx.  Both catheters were secured at the skin exit sites with Prolene retention sutures and attached to gravity bags. Dressings were placed. The patient tolerated both procedures well without immediate postprocedural complication.  FINDINGS: Ultrasound scanning demonstrates moderate to severely dilated bilateral renal collecting systems.  Under direct ultrasound guidance, bilateral 10-French percutaneous nephrostomy catheters were placed via a posterior inferior calyx under intermittent fluoroscopic guidance. Contrast injection confirmed appropriate positioning.  IMPRESSION: Successful ultrasound and fluoroscopic guided placement of a bilateral sided 10 [place] PCNs.   Electronically Signed   By: Sandi Mariscal M.D.   On: 09/21/2014 14:59   Ir US Guide Bx Asp/drain  09/21/2014   INDICATION: History of bladder mass, now with bilateral obstructive uropathy an elevated creatinine. Please perform bilateral percutaneous nephrostomy catheter placement for renal preservation purposes.  EXAM: 1.  ULTRASOUND GUIDANCE FOR PUNCTURE OF  THE BILATERAL RENAL COLLECTING SYSTEMS 2. BILATERAL PERCUTANEOUS NEPHROSTOMY TUBE PLACEMENT.  COMPARISON:  Renal ultrasound - 09/07/2014; CT abdomen pelvis - 09/21/2014  MEDICATIONS: Clindamycin 600 mg IV; The antibiotic was administered in an appropriate time frame prior to skin puncture.  ANESTHESIA/SEDATION: Fentanyl 50 mcg IV; Versed 1.5 mg IV  Total Moderate Sedation Time  50 minutes.  CONTRAST:  A total of 40 mL Isovue 300 was administered into both collecting systems  FLUOROSCOPY TIME:  5 minutes 6 seconds.  (48 mGy)  COMPLICATIONS: None immediate  PROCEDURE: The procedure, risks, benefits, and alternatives were explained to the patient. Questions regarding the procedure were encouraged and answered. The patient understands and consents to the procedure. A timeout was performed prior to the initiation of the procedure.  The bilateral flanks were prepped with Betadine in a sterile fashion, and a sterile drape was applied covering the operative field. A sterile gown and sterile gloves were used for the procedure. Local anesthesia was provided with 1% Lidocaine with epinephrine. Ultrasound was used to localize the left kidney. Under direct ultrasound guidance, a 21 gauge needle was advanced into the renal collecting system. An ultrasound image documentation was performed. Access within the collecting system was confirmed with the efflux of urine followed by contrast injection.  Over a Nitrex wire, the inner three [place] catheter of an Accustick set was advanced into the renal collecting system. Contrast injection was injected into the collecting system as several spot radiographs were obtained in various obliquities confirming puncture within a posterior inferior calix. As such, the tract was dilated with an Accustick stent. Over a guide wire, a 10-French percutaneous nephrostomy catheter was advanced into the collecting system where the coil was formed and locked. Contrast  was injected and several sport radiographs were obtained in various obliquities confirming access.  The identical procedure was repeated for the contralateral right kidney ultimately allowing successful placement of a 10 French percutaneous nephrostomy catheter via a posterior inferior calyx.  Both catheters were secured at the skin exit sites with Prolene retention sutures and attached to gravity bags. Dressings were placed. The patient tolerated both procedures well without immediate postprocedural complication.  FINDINGS: Ultrasound scanning demonstrates moderate to severely dilated bilateral renal collecting systems.  Under direct ultrasound guidance, bilateral 10-French percutaneous nephrostomy catheters were placed via a posterior inferior calyx under intermittent fluoroscopic guidance. Contrast injection confirmed appropriate positioning.  IMPRESSION: Successful ultrasound and fluoroscopic guided placement of a bilateral sided 10 [place] PCNs.   Electronically Signed   By: Sandi Mariscal M.D.   On: 09/21/2014 14:59    Brief H and P: For complete details please refer to admission H and P, but in brief the patient was admitted for cystoscopy and bladder biopsy to better elucidate his etiology behind bilateral hydronephrosis with renal insufficiency.  Hospital Course:  Active Problems:   Bladder tumor  The patient was admitted directly to the operating room where, on the day of admission he underwent cystoscopy and bladder biopsy. Because of his significant bladder involvement with his neoplastic process, I was unable to identify his ureteral orifices were retrograde ureteropyelograms. He did have bilateral percutaneous nephrostomy tubes placed the week before. Bladder biopsies were obtained, and a catheter was left in which was removed on postoperative day #1. The patient had no significant issues following his procedure, and was discharged the hospital on postoperative day #1 in stable condition. Day of  Discharge BP 145/71 mmHg  Pulse 86  Temp(Src) 98.1 F (36.7 C) (Oral)  Resp  18  Ht 5\' 4"  (1.626 m)  Wt 57.153 kg (126 lb)  BMI 21.62 kg/m2  SpO2 94%  No results found for this or any previous visit (from the past 24 hour(s)).  Physical Exam: General: Alert and awake oriented x3 not in any acute distress. HEENT: anicteric sclera, pupils reactive to light and accommodation CVS: S1-S2 clear no murmur rubs or gallops Chest: clear to auscultation bilaterally, no wheezing rales or rhonchi Abdomen: soft nontender, nondistended, normal bowel sounds, no organomegaly Extremities: no cyanosis, clubbing or edema noted bilaterally Neuro: Cranial nerves II-XII intact, no focal neurological deficits  Disposition:  Home with home health care assistance  Diet:  Renal  Activity:  As tolerated   Disposition and Follow-up:    He will follow-up with [place] in the office  TESTS THAT NEED FOLLOW-UP  Pathology will be reviewed  DISCHARGE FOLLOW-UP     Follow-up Information    Follow up with Jorja Loa, MD.   Specialty:  Urology   Why:  Office to call to schedule   Contact information:   [place] [address] [phone]       Time spent on Discharge:  15 minutes  Signed: Jorja Loa 10/03/2014, 10:39 AM

## 2014-10-03 NOTE — Discharge Summary (Addendum)
Patient ID: Benjamin Macdonald MRN: [id number] DOB/AGE: 12/30/1920 80 y.o.  Admit date: 09/21/2014 Discharge date: 10/03/2014  Primary Care Physician:   Melinda Crutch, MD  Discharge Diagnoses:   Present on Admission:  . Hydronephrosis, bilateral . Bladder tumor  Acute and chronic renal insufficiency  Consults:  None     Discharge Medications:   Medication List    STOP taking these medications        clopidogrel 75 MG tablet  Commonly known as:  PLAVIX      TAKE these medications        furosemide 80 MG tablet  Commonly known as:  LASIX  Take 80 mg by mouth 2 (two) times daily.     gabapentin 100 MG capsule  Commonly known as:  NEURONTIN  Take 100 mg by mouth every morning.     iron polysaccharides 150 MG capsule  Commonly known as:  NIFEREX  Take 150 mg by mouth 2 (two) times daily.     lidocaine 5 %  Commonly known as:  LIDODERM  Place 1 patch onto the skin every 12 (twelve) hours as needed (for pain). Remove & Discard patch within 12 hours or as directed by MD     methadone 5 MG tablet  Commonly known as:  DOLOPHINE  Take 5 mg by mouth every 6 (six) hours.     multivitamins ther. w/minerals Tabs tablet  Take 1 tablet by mouth every morning.     NIFEdipine 30 MG 24 hr tablet  Commonly known as:  PROCARDIA-XL/ADALAT CC  Take 30 mg by mouth 2 (two) times daily.     simvastatin 40 MG tablet  Commonly known as:  ZOCOR  Take 40 mg by mouth every evening.         Significant Diagnostic Studies:  Ct Abdomen Pelvis Wo Contrast  09/21/2014   CLINICAL DATA:  History of bladder mass, now with bilateral hydronephrosis. Please perform noncontrast abdominal CT to assist with staging and for planning purposes prior to potential bilateral percutaneous nephrostomy tube placement.  EXAM: CT ABDOMEN AND PELVIS WITHOUT CONTRAST  TECHNIQUE: Multidetector CT imaging of the abdomen and pelvis was performed following the standard protocol without IV contrast.  COMPARISON:  Renal  ultrasound - 09/07/2014  FINDINGS: The lack of intravenous contrast limits the ability to evaluate solid abdominal organs.  Normal hepatic contour. Punctate (approximately 0.4 cm) radiopaque stone lying dependently within an otherwise normal-appearing gallbladder. Potential periportal edema is suspected on this noncontrast examination. No ascites.  There is severe right and moderate left-sided pelvicaliectasis and ureterectasis to the level of the urinary bladder, though note, evaluation of the distal aspect of the bilateral ureters is degraded secondary to lack of mesenteric fact as well as beam hardening artifact from the patient's right total hip replacement. No renal stones.  There is diffuse though slightly asymmetric urinary bladder wall thickening, left greater than right. No definitive bulky pelvic or inguinal lymphadenopathy on this noncontrast examination.  There is mild diffuse thickening of the bilateral adrenal glands without discrete nodule. The pancreas appears atrophic. Normal appearance of the spleen. Incidental note is made of a small splenule.  Moderate colonic stool burden without evidence of enteric obstruction. The bowel is normal in course and caliber without wall thickening or evidence of obstruction. The appendix is not visualized however there is no inflammatory change within the right lower abdominal quadrant. No pneumoperitoneum, pneumatosis or portal venous gas.  Moderate to large amount of slightly irregular calcified atherosclerotic plaque within  a tortuous but normal caliber abdominal aorta. Calcified atherosclerotic plaque projects into the lumen of the mid aspect of the infrarenal abdominal aorta (represent axial image 23, series 2, coronal image 32, series 602). This finding is without associated periaortic stranding.  No definitive bulky retroperitoneal or mesenteric adenopathy on this noncontrast examination.  Limited visualization of the lower thorax demonstrates minimal  consolidative opacities within the right lower lobe, the cranial extent of which is not imaged on this examination. There is minimal subsegmental atelectasis within the caudal segments of the right middle lobe and lingula. No pleural effusion.  Cardiomegaly. Calcifications within the mitral valve annulus. No pericardial effusion.  Mild scoliotic curvature of the thoracolumbar spine with associated multilevel moderate to severe DDD. Post right total hip replacement, the caudal extent is not imaged without definitive evidence of hardware failure or loosening.  Patient appears cachectic. Regional soft tissues appear otherwise normal.  IMPRESSION: 1. Diffuse though slightly asymmetric thickening of the urinary bladder wall, left greater than right. The patient's known urinary bladder mass is not well demonstrated on this noncontrast examination possibly attributable to streak artifact from the patient's right total hip replacement. 2. Severe right and moderate left pelvicaliectasis and ureterectasis to the level of the urinary bladder presumably secondary to the patient's known bladder mass. 3. No definite evidence of metastatic disease to the abdomen or pelvis on this noncontrast examination. 4. Consolidative opacities within the imaged right lower lobe, the cranial extent of which is not imaged on this examination. Further evaluation with staging chest CT could be performed as clinically indicated. 5. Cholelithiasis without evidence of cholecystitis. 6. Suspected mild periportal edema on this noncontrast examination. Correlation with LFTs is recommended.   Electronically Signed   By: Sandi Mariscal M.D.   On: 09/21/2014 10:19   Ir Perc Nephrostomy Left  09/21/2014   INDICATION: History of bladder mass, now with bilateral obstructive uropathy an elevated creatinine. Please perform bilateral percutaneous nephrostomy catheter placement for renal preservation purposes.  EXAM: 1. ULTRASOUND GUIDANCE FOR PUNCTURE OF THE  BILATERAL RENAL COLLECTING SYSTEMS 2. BILATERAL PERCUTANEOUS NEPHROSTOMY TUBE PLACEMENT.  COMPARISON:  Renal ultrasound - 09/07/2014; CT abdomen pelvis - 09/21/2014  MEDICATIONS: Clindamycin 600 mg IV; The antibiotic was administered in an appropriate time frame prior to skin puncture.  ANESTHESIA/SEDATION: Fentanyl 50 mcg IV; Versed 1.5 mg IV  Total Moderate Sedation Time  50 minutes.  CONTRAST:  A total of 40 mL Isovue 300 was administered into both collecting systems  FLUOROSCOPY TIME:  5 minutes 6 seconds.  (48 mGy)  COMPLICATIONS: None immediate  PROCEDURE: The procedure, risks, benefits, and alternatives were explained to the patient. Questions regarding the procedure were encouraged and answered. The patient understands and consents to the procedure. A timeout was performed prior to the initiation of the procedure.  The bilateral flanks were prepped with Betadine in a sterile fashion, and a sterile drape was applied covering the operative field. A sterile gown and sterile gloves were used for the procedure. Local anesthesia was provided with 1% Lidocaine with epinephrine. Ultrasound was used to localize the left kidney. Under direct ultrasound guidance, a 21 gauge needle was advanced into the renal collecting system. An ultrasound image documentation was performed. Access within the collecting system was confirmed with the efflux of urine followed by contrast injection.  Over a Nitrex wire, the inner three [place] catheter of an Accustick set was advanced into the renal collecting system. Contrast injection was injected into the collecting system as several spot radiographs  were obtained in various obliquities confirming puncture within a posterior inferior calix. As such, the tract was dilated with an Accustick stent. Over a guide wire, a 10-French percutaneous nephrostomy catheter was advanced into the collecting system where the coil was formed and locked. Contrast was injected and several sport radiographs  were obtained in various obliquities confirming access.  The identical procedure was repeated for the contralateral right kidney ultimately allowing successful placement of a 10 French percutaneous nephrostomy catheter via a posterior inferior calyx.  Both catheters were secured at the skin exit sites with Prolene retention sutures and attached to gravity bags. Dressings were placed. The patient tolerated both procedures well without immediate postprocedural complication.  FINDINGS: Ultrasound scanning demonstrates moderate to severely dilated bilateral renal collecting systems.  Under direct ultrasound guidance, bilateral 10-French percutaneous nephrostomy catheters were placed via a posterior inferior calyx under intermittent fluoroscopic guidance. Contrast injection confirmed appropriate positioning.  IMPRESSION: Successful ultrasound and fluoroscopic guided placement of a bilateral sided 10 [place] PCNs.   Electronically Signed   By: Sandi Mariscal M.D.   On: 09/21/2014 14:59   Ir Perc Nephrostomy Right  09/21/2014   INDICATION: History of bladder mass, now with bilateral obstructive uropathy an elevated creatinine. Please perform bilateral percutaneous nephrostomy catheter placement for renal preservation purposes.  EXAM: 1. ULTRASOUND GUIDANCE FOR PUNCTURE OF THE BILATERAL RENAL COLLECTING SYSTEMS 2. BILATERAL PERCUTANEOUS NEPHROSTOMY TUBE PLACEMENT.  COMPARISON:  Renal ultrasound - 09/07/2014; CT abdomen pelvis - 09/21/2014  MEDICATIONS: Clindamycin 600 mg IV; The antibiotic was administered in an appropriate time frame prior to skin puncture.  ANESTHESIA/SEDATION: Fentanyl 50 mcg IV; Versed 1.5 mg IV  Total Moderate Sedation Time  50 minutes.  CONTRAST:  A total of 40 mL Isovue 300 was administered into both collecting systems  FLUOROSCOPY TIME:  5 minutes 6 seconds.  (48 mGy)  COMPLICATIONS: None immediate  PROCEDURE: The procedure, risks, benefits, and alternatives were explained to the patient. Questions  regarding the procedure were encouraged and answered. The patient understands and consents to the procedure. A timeout was performed prior to the initiation of the procedure.  The bilateral flanks were prepped with Betadine in a sterile fashion, and a sterile drape was applied covering the operative field. A sterile gown and sterile gloves were used for the procedure. Local anesthesia was provided with 1% Lidocaine with epinephrine. Ultrasound was used to localize the left kidney. Under direct ultrasound guidance, a 21 gauge needle was advanced into the renal collecting system. An ultrasound image documentation was performed. Access within the collecting system was confirmed with the efflux of urine followed by contrast injection.  Over a Nitrex wire, the inner three [place] catheter of an Accustick set was advanced into the renal collecting system. Contrast injection was injected into the collecting system as several spot radiographs were obtained in various obliquities confirming puncture within a posterior inferior calix. As such, the tract was dilated with an Accustick stent. Over a guide wire, a 10-French percutaneous nephrostomy catheter was advanced into the collecting system where the coil was formed and locked. Contrast was injected and several sport radiographs were obtained in various obliquities confirming access.  The identical procedure was repeated for the contralateral right kidney ultimately allowing successful placement of a 10 French percutaneous nephrostomy catheter via a posterior inferior calyx.  Both catheters were secured at the skin exit sites with Prolene retention sutures and attached to gravity bags. Dressings were placed. The patient tolerated both procedures well without immediate postprocedural complication.  FINDINGS: Ultrasound scanning demonstrates moderate to severely dilated bilateral renal collecting systems.  Under direct ultrasound guidance, bilateral 10-French percutaneous  nephrostomy catheters were placed via a posterior inferior calyx under intermittent fluoroscopic guidance. Contrast injection confirmed appropriate positioning.  IMPRESSION: Successful ultrasound and fluoroscopic guided placement of a bilateral sided 10 [place] PCNs.   Electronically Signed   By: Sandi Mariscal M.D.   On: 09/21/2014 14:59   Ir US Guide Bx Asp/drain  09/21/2014   INDICATION: History of bladder mass, now with bilateral obstructive uropathy an elevated creatinine. Please perform bilateral percutaneous nephrostomy catheter placement for renal preservation purposes.  EXAM: 1. ULTRASOUND GUIDANCE FOR PUNCTURE OF THE BILATERAL RENAL COLLECTING SYSTEMS 2. BILATERAL PERCUTANEOUS NEPHROSTOMY TUBE PLACEMENT.  COMPARISON:  Renal ultrasound - 09/07/2014; CT abdomen pelvis - 09/21/2014  MEDICATIONS: Clindamycin 600 mg IV; The antibiotic was administered in an appropriate time frame prior to skin puncture.  ANESTHESIA/SEDATION: Fentanyl 50 mcg IV; Versed 1.5 mg IV  Total Moderate Sedation Time  50 minutes.  CONTRAST:  A total of 40 mL Isovue 300 was administered into both collecting systems  FLUOROSCOPY TIME:  5 minutes 6 seconds.  (48 mGy)  COMPLICATIONS: None immediate  PROCEDURE: The procedure, risks, benefits, and alternatives were explained to the patient. Questions regarding the procedure were encouraged and answered. The patient understands and consents to the procedure. A timeout was performed prior to the initiation of the procedure.  The bilateral flanks were prepped with Betadine in a sterile fashion, and a sterile drape was applied covering the operative field. A sterile gown and sterile gloves were used for the procedure. Local anesthesia was provided with 1% Lidocaine with epinephrine. Ultrasound was used to localize the left kidney. Under direct ultrasound guidance, a 21 gauge needle was advanced into the renal collecting system. An ultrasound image documentation was performed. Access within the  collecting system was confirmed with the efflux of urine followed by contrast injection.  Over a Nitrex wire, the inner three [place] catheter of an Accustick set was advanced into the renal collecting system. Contrast injection was injected into the collecting system as several spot radiographs were obtained in various obliquities confirming puncture within a posterior inferior calix. As such, the tract was dilated with an Accustick stent. Over a guide wire, a 10-French percutaneous nephrostomy catheter was advanced into the collecting system where the coil was formed and locked. Contrast was injected and several sport radiographs were obtained in various obliquities confirming access.  The identical procedure was repeated for the contralateral right kidney ultimately allowing successful placement of a 10 French percutaneous nephrostomy catheter via a posterior inferior calyx.  Both catheters were secured at the skin exit sites with Prolene retention sutures and attached to gravity bags. Dressings were placed. The patient tolerated both procedures well without immediate postprocedural complication.  FINDINGS: Ultrasound scanning demonstrates moderate to severely dilated bilateral renal collecting systems.  Under direct ultrasound guidance, bilateral 10-French percutaneous nephrostomy catheters were placed via a posterior inferior calyx under intermittent fluoroscopic guidance. Contrast injection confirmed appropriate positioning.  IMPRESSION: Successful ultrasound and fluoroscopic guided placement of a bilateral sided 10 [place] PCNs.   Electronically Signed   By: Sandi Mariscal M.D.   On: 09/21/2014 14:59   Ir US Guide Bx Asp/drain  09/21/2014   INDICATION: History of bladder mass, now with bilateral obstructive uropathy an elevated creatinine. Please perform bilateral percutaneous nephrostomy catheter placement for renal preservation purposes.  EXAM: 1. ULTRASOUND GUIDANCE FOR PUNCTURE OF THE BILATERAL RENAL  COLLECTING  SYSTEMS 2. BILATERAL PERCUTANEOUS NEPHROSTOMY TUBE PLACEMENT.  COMPARISON:  Renal ultrasound - 09/07/2014; CT abdomen pelvis - 09/21/2014  MEDICATIONS: Clindamycin 600 mg IV; The antibiotic was administered in an appropriate time frame prior to skin puncture.  ANESTHESIA/SEDATION: Fentanyl 50 mcg IV; Versed 1.5 mg IV  Total Moderate Sedation Time  50 minutes.  CONTRAST:  A total of 40 mL Isovue 300 was administered into both collecting systems  FLUOROSCOPY TIME:  5 minutes 6 seconds.  (48 mGy)  COMPLICATIONS: None immediate  PROCEDURE: The procedure, risks, benefits, and alternatives were explained to the patient. Questions regarding the procedure were encouraged and answered. The patient understands and consents to the procedure. A timeout was performed prior to the initiation of the procedure.  The bilateral flanks were prepped with Betadine in a sterile fashion, and a sterile drape was applied covering the operative field. A sterile gown and sterile gloves were used for the procedure. Local anesthesia was provided with 1% Lidocaine with epinephrine. Ultrasound was used to localize the left kidney. Under direct ultrasound guidance, a 21 gauge needle was advanced into the renal collecting system. An ultrasound image documentation was performed. Access within the collecting system was confirmed with the efflux of urine followed by contrast injection.  Over a Nitrex wire, the inner three [place] catheter of an Accustick set was advanced into the renal collecting system. Contrast injection was injected into the collecting system as several spot radiographs were obtained in various obliquities confirming puncture within a posterior inferior calix. As such, the tract was dilated with an Accustick stent. Over a guide wire, a 10-French percutaneous nephrostomy catheter was advanced into the collecting system where the coil was formed and locked. Contrast was injected and several sport radiographs were obtained  in various obliquities confirming access.  The identical procedure was repeated for the contralateral right kidney ultimately allowing successful placement of a 10 French percutaneous nephrostomy catheter via a posterior inferior calyx.  Both catheters were secured at the skin exit sites with Prolene retention sutures and attached to gravity bags. Dressings were placed. The patient tolerated both procedures well without immediate postprocedural complication.  FINDINGS: Ultrasound scanning demonstrates moderate to severely dilated bilateral renal collecting systems.  Under direct ultrasound guidance, bilateral 10-French percutaneous nephrostomy catheters were placed via a posterior inferior calyx under intermittent fluoroscopic guidance. Contrast injection confirmed appropriate positioning.  IMPRESSION: Successful ultrasound and fluoroscopic guided placement of a bilateral sided 10 [place] PCNs.   Electronically Signed   By: Sandi Mariscal M.D.   On: 09/21/2014 14:59    Brief H and P: For complete details please refer to admission H and P, but in brief the patient is an elderly male with worsening renal insufficiency and bilateral hydronephrosis. He has a bladder mass on CT scan. Because of his worsening renal insufficiency, he is admitted for percutaneous nephrostomy tube placement.  Hospital Course:  Active Problems:   Hydronephrosis, bilateral   Bladder tumor   Day of Discharge BP 134/66 mmHg  Pulse 68  Temp(Src) 98.3 F (36.8 C) (Oral)  Resp 16  Ht 5\' 4"  (1.626 m)  Wt 57.38 kg (126 lb 8 oz)  BMI 21.70 kg/m2  SpO2 99%  No results found for this or any previous visit (from the past 24 hour(s)).  Physical Exam: General: Alert and awake oriented x3 not in any acute distress. HEENT: anicteric sclera, pupils reactive to light and accommodation CVS: S1-S2 clear no murmur rubs or gallops Chest: clear to auscultation bilaterally, no wheezing rales  or rhonchi Abdomen: soft nontender, nondistended,  normal bowel sounds, no organomegaly Extremities: no cyanosis, clubbing or edema noted bilaterally Neuro: Cranial nerves II-XII intact, no focal neurological deficits  Disposition:  Home with home health care referral  Diet:  Renal diet  Activity:  As tolerated   Disposition and Follow-up:     Discharge Instructions    Care order/instruction    Complete by:  As directed   Instruct patient/wife how to attach and emopty percutaneous ranges bags     Discharge patient    Complete by:  As directed   After urine bag teaching, home health care referral              TESTS THAT NEED FOLLOW-UP  Follow-up BUN/creatinine  DISCHARGE FOLLOW-UP Follow-up Information    Follow up with [place].   Contact information:   [place] [id number] [phone]       Time spent on Discharge:  15 minutes  Signed: Jorja Loa 10/03/2014, 10:36 AM

## 2014-10-04 ENCOUNTER — Other Ambulatory Visit (HOSPITAL_COMMUNITY): Payer: Self-pay | Admitting: *Deleted

## 2014-10-05 ENCOUNTER — Ambulatory Visit (HOSPITAL_COMMUNITY)
Admission: RE | Admit: 2014-10-05 | Discharge: 2014-10-05 | Disposition: A | Discharge status: Home / Self Care | Payer: Medicare Other | Source: Ambulatory Visit | Attending: Nephrology | Admitting: Nephrology

## 2014-10-05 DIAGNOSIS — I129 Hypertensive chronic kidney disease with stage 1 through stage 4 chronic kidney disease, or unspecified chronic kidney disease: Secondary | ICD-10-CM | POA: Diagnosis present

## 2014-10-05 DIAGNOSIS — N184 Chronic kidney disease, stage 4 (severe): Secondary | ICD-10-CM | POA: Diagnosis not present

## 2014-10-05 LAB — POCT HEMOGLOBIN-HEMACUE: Hemoglobin: 7.7 g/dL — ABNORMAL LOW (ref 13.0–17.0)

## 2014-10-05 MED ORDER — DARBEPOETIN ALFA 100 MCG/0.5ML IJ SOSY
100.0000 ug | PREFILLED_SYRINGE | INTRAMUSCULAR | Status: DC
  Administered 2014-10-05: 100 ug via SUBCUTANEOUS

## 2014-10-05 MED ORDER — DARBEPOETIN ALFA 100 MCG/0.5ML IJ SOSY
PREFILLED_SYRINGE | INTRAMUSCULAR | Status: AC
  Filled 2014-10-05: qty 0.5

## 2014-10-05 MED ORDER — FERUMOXYTOL INJECTION 510 MG/17 ML
510.0000 mg | Freq: Once | INTRAVENOUS | Status: AC
  Administered 2014-10-05: 510 mg via INTRAVENOUS
  Filled 2014-10-05: qty 17

## 2014-10-05 NOTE — Discharge Instructions (Signed)
Darbepoetin Alfa injection What is this medicine? DARBEPOETIN ALFA (dar be POE e tin AL fa) helps your body make more red blood cells. It is used to treat anemia caused by chronic kidney failure and chemotherapy. This medicine may be used for other purposes; ask your health care provider or pharmacist if you have questions. COMMON BRAND NAME(S): Aranesp What should I tell my health care provider before I take this medicine? They need to know if you have any of these conditions: -blood clotting disorders or history of blood clots -cancer patient not on chemotherapy -cystic fibrosis -heart disease, such as angina, heart failure, or a history of a heart attack -hemoglobin level of 12 g/dL or greater -high blood pressure -low levels of folate, iron, or vitamin B12 -seizures -an unusual or allergic reaction to darbepoetin, erythropoietin, albumin, hamster proteins, latex, other medicines, foods, dyes, or preservatives -pregnant or trying to get pregnant -breast-feeding How should I use this medicine? This medicine is for injection into a vein or under the skin. It is usually given by a health care professional in a hospital or clinic setting. If you get this medicine at home, you will be taught how to prepare and give this medicine. Do not shake the solution before you withdraw a dose. Use exactly as directed. Take your medicine at regular intervals. Do not take your medicine more often than directed. It is important that you put your used needles and syringes in a special sharps container. Do not put them in a trash can. If you do not have a sharps container, call your pharmacist or healthcare provider to get one. Talk to your pediatrician regarding the use of this medicine in children. While this medicine may be used in children as young as 1 year for selected conditions, precautions do apply. Overdosage: If you think you have taken too much of this medicine contact a poison control center or  emergency room at once. NOTE: This medicine is only for you. Do not share this medicine with others. What if I miss a dose? If you miss a dose, take it as soon as you can. If it is almost time for your next dose, take only that dose. Do not take double or extra doses. What may interact with this medicine? Do not take this medicine with any of the following medications: -epoetin alfa This list may not describe all possible interactions. Give your health care provider a list of all the medicines, herbs, non-prescription drugs, or dietary supplements you use. Also tell them if you smoke, drink alcohol, or use illegal drugs. Some items may interact with your medicine. What should I watch for while using this medicine? Visit your prescriber or health care professional for regular checks on your progress and for the needed blood tests and blood pressure measurements. It is especially important for the doctor to make sure your hemoglobin level is in the desired range, to limit the risk of potential side effects and to give you the best benefit. Keep all appointments for any recommended tests. Check your blood pressure as directed. Ask your doctor what your blood pressure should be and when you should contact him or her. As your body makes more red blood cells, you may need to take iron, folic acid, or vitamin B supplements. Ask your doctor or health care provider which products are right for you. If you have kidney disease continue dietary restrictions, even though this medication can make you feel better. Talk with your doctor or health   care professional about the foods you eat and the vitamins that you take. What side effects may I notice from receiving this medicine? Side effects that you should report to your doctor or health care professional as soon as possible: -allergic reactions like skin rash, itching or hives, swelling of the face, lips, or tongue -breathing problems -changes in vision -chest  pain -confusion, trouble speaking or understanding -feeling faint or lightheaded, falls -high blood pressure -muscle aches or pains -pain, swelling, warmth in the leg -rapid weight gain -severe headaches -sudden numbness or weakness of the face, arm or leg -trouble walking, dizziness, loss of balance or coordination -seizures (convulsions) -swelling of the ankles, feet, hands -unusually weak or tired Side effects that usually do not require medical attention (report to your doctor or health care professional if they continue or are bothersome): -diarrhea -fever, chills (flu-like symptoms) -headaches -nausea, vomiting -redness, stinging, or swelling at site where injected This list may not describe all possible side effects. Call your doctor for medical advice about side effects. You may report side effects to FDA at 1-800-FDA-1088. Where should I keep my medicine? Keep out of the reach of children. Store in a refrigerator between 2 and 8 degrees C (36 and 46 degrees F). Do not freeze. Do not shake. Throw away any unused portion if using a single-dose vial. Throw away any unused medicine after the expiration date. NOTE: This sheet is a summary. It may not cover all possible information. If you have questions about this medicine, talk to your doctor, pharmacist, or health care provider.  2015, [employer]. (2008-09-20 10:23:57)  

## 2014-10-11 ENCOUNTER — Other Ambulatory Visit: Payer: Self-pay | Admitting: Urology

## 2014-10-11 DIAGNOSIS — N1339 Other hydronephrosis: Secondary | ICD-10-CM

## 2014-10-31 ENCOUNTER — Other Ambulatory Visit (HOSPITAL_COMMUNITY): Payer: Medicare Other

## 2014-11-01 ENCOUNTER — Other Ambulatory Visit: Payer: Self-pay | Admitting: Urology

## 2014-11-01 ENCOUNTER — Ambulatory Visit (HOSPITAL_COMMUNITY)
Admission: RE | Admit: 2014-11-01 | Discharge: 2014-11-01 | Disposition: A | Discharge status: Home / Self Care | Payer: Medicare Other | Source: Ambulatory Visit | Attending: Urology | Admitting: Urology

## 2014-11-01 DIAGNOSIS — Z436 Encounter for attention to other artificial openings of urinary tract: Secondary | ICD-10-CM | POA: Diagnosis present

## 2014-11-01 DIAGNOSIS — N1339 Other hydronephrosis: Secondary | ICD-10-CM

## 2014-11-01 MED ORDER — LIDOCAINE HCL 1 % IJ SOLN
INTRAMUSCULAR | Status: AC
  Filled 2014-11-01: qty 20

## 2014-11-01 MED ORDER — IOHEXOL 300 MG/ML  SOLN
50.0000 mL | Freq: Once | INTRAMUSCULAR | Status: AC | PRN
  Administered 2014-11-01: 20 mL

## 2014-11-01 NOTE — Procedures (Signed)
10 Fr B PCN exchange No comp

## 2014-11-02 ENCOUNTER — Encounter (HOSPITAL_COMMUNITY)
Admission: RE | Admit: 2014-11-02 | Discharge: 2014-11-02 | Disposition: A | Discharge status: Home / Self Care | Payer: Medicare Other | Source: Ambulatory Visit | Attending: Nephrology | Admitting: Nephrology

## 2014-11-02 DIAGNOSIS — N185 Chronic kidney disease, stage 5: Secondary | ICD-10-CM | POA: Insufficient documentation

## 2014-11-02 DIAGNOSIS — D638 Anemia in other chronic diseases classified elsewhere: Secondary | ICD-10-CM | POA: Insufficient documentation

## 2014-11-02 LAB — POCT HEMOGLOBIN-HEMACUE: Hemoglobin: 10.2 g/dL — ABNORMAL LOW (ref 13.0–17.0)

## 2014-11-02 MED ORDER — DARBEPOETIN ALFA 100 MCG/0.5ML IJ SOSY: 100.0000 ug | PREFILLED_SYRINGE | INTRAMUSCULAR | Status: DC

## 2014-11-02 MED ORDER — DARBEPOETIN ALFA 100 MCG/0.5ML IJ SOSY
PREFILLED_SYRINGE | INTRAMUSCULAR | Status: AC
  Administered 2014-11-02: 100 ug via SUBCUTANEOUS
  Filled 2014-11-02: qty 0.5

## 2014-11-30 ENCOUNTER — Encounter (HOSPITAL_COMMUNITY)
Admission: RE | Admit: 2014-11-30 | Discharge: 2014-11-30 | Disposition: A | Discharge status: Home / Self Care | Payer: Medicare Other | Source: Ambulatory Visit | Attending: Nephrology | Admitting: Nephrology

## 2014-11-30 DIAGNOSIS — D638 Anemia in other chronic diseases classified elsewhere: Secondary | ICD-10-CM | POA: Insufficient documentation

## 2014-11-30 DIAGNOSIS — N185 Chronic kidney disease, stage 5: Secondary | ICD-10-CM | POA: Insufficient documentation

## 2014-11-30 LAB — POCT HEMOGLOBIN-HEMACUE: Hemoglobin: 11.3 g/dL — ABNORMAL LOW (ref 13.0–17.0)

## 2014-11-30 MED ORDER — DARBEPOETIN ALFA 100 MCG/0.5ML IJ SOSY: 100.0000 ug | PREFILLED_SYRINGE | INTRAMUSCULAR | Status: DC

## 2014-12-01 LAB — FERRITIN: Ferritin: 144 ng/mL (ref 22–322)

## 2014-12-01 LAB — IRON AND TIBC
Iron: 22 ug/dL — ABNORMAL LOW (ref 42–165)
Saturation Ratios: 8 % — ABNORMAL LOW (ref 20–55)
TIBC: 284 ug/dL (ref 215–435)
UIBC: 262 ug/dL (ref 125–400)

## 2014-12-01 MED ORDER — DARBEPOETIN ALFA 200 MCG/0.4ML IJ SOSY
PREFILLED_SYRINGE | INTRAMUSCULAR | Status: AC
  Administered 2014-11-30: 200 ug
  Filled 2014-12-01: qty 0.4

## 2014-12-01 MED ORDER — DARBEPOETIN ALFA 100 MCG/0.5ML IJ SOSY
PREFILLED_SYRINGE | INTRAMUSCULAR | Status: AC
  Filled 2014-12-01: qty 0.5

## 2014-12-07 ENCOUNTER — Other Ambulatory Visit (HOSPITAL_COMMUNITY): Payer: Self-pay | Admitting: *Deleted

## 2014-12-08 ENCOUNTER — Encounter (HOSPITAL_COMMUNITY)
Admission: RE | Admit: 2014-12-08 | Discharge: 2014-12-08 | Disposition: A | Discharge status: Home / Self Care | Payer: Medicare Other | Source: Ambulatory Visit | Attending: Nephrology | Admitting: Nephrology

## 2014-12-08 DIAGNOSIS — D638 Anemia in other chronic diseases classified elsewhere: Secondary | ICD-10-CM | POA: Diagnosis not present

## 2014-12-08 MED ORDER — SODIUM CHLORIDE 0.9 % IV SOLN
510.0000 mg | Freq: Once | INTRAVENOUS | Status: AC
  Administered 2014-12-08: 510 mg via INTRAVENOUS
  Filled 2014-12-08: qty 17

## 2014-12-08 MED ORDER — DARBEPOETIN ALFA 200 MCG/0.4ML IJ SOSY
200.0000 ug | PREFILLED_SYRINGE | INTRAMUSCULAR | Status: DC
Start: 2014-12-08 — End: 2014-12-09

## 2014-12-13 ENCOUNTER — Ambulatory Visit (HOSPITAL_COMMUNITY)
Admission: RE | Admit: 2014-12-13 | Discharge: 2014-12-13 | Disposition: A | Discharge status: Home / Self Care | Payer: Medicare Other | Source: Ambulatory Visit | Attending: Diagnostic Radiology | Admitting: Diagnostic Radiology

## 2014-12-13 ENCOUNTER — Other Ambulatory Visit: Payer: Self-pay | Admitting: Urology

## 2014-12-13 DIAGNOSIS — Z436 Encounter for attention to other artificial openings of urinary tract: Secondary | ICD-10-CM | POA: Insufficient documentation

## 2014-12-13 DIAGNOSIS — N1330 Unspecified hydronephrosis: Secondary | ICD-10-CM | POA: Diagnosis not present

## 2014-12-13 DIAGNOSIS — N1339 Other hydronephrosis: Secondary | ICD-10-CM

## 2014-12-13 DIAGNOSIS — C679 Malignant neoplasm of bladder, unspecified: Secondary | ICD-10-CM | POA: Insufficient documentation

## 2014-12-13 MED ORDER — IOHEXOL 300 MG/ML  SOLN
10.0000 mL | Freq: Once | INTRAMUSCULAR | Status: AC | PRN
  Administered 2014-12-13: 10 mL

## 2014-12-13 MED ORDER — LIDOCAINE HCL 1 % IJ SOLN
INTRAMUSCULAR | Status: AC
  Filled 2014-12-13: qty 20

## 2014-12-13 NOTE — Procedures (Signed)
Exchange of bilateral nephrostomy tubes.  No immediate complication.  See Imaging Report.

## 2014-12-27 ENCOUNTER — Other Ambulatory Visit (HOSPITAL_COMMUNITY): Payer: Self-pay | Admitting: *Deleted

## 2014-12-28 ENCOUNTER — Encounter (HOSPITAL_COMMUNITY)
Admission: RE | Admit: 2014-12-28 | Discharge: 2014-12-28 | Disposition: A | Discharge status: Home / Self Care | Payer: Medicare Other | Source: Ambulatory Visit | Attending: Nephrology | Admitting: Nephrology

## 2014-12-28 DIAGNOSIS — D638 Anemia in other chronic diseases classified elsewhere: Secondary | ICD-10-CM | POA: Insufficient documentation

## 2014-12-28 DIAGNOSIS — N185 Chronic kidney disease, stage 5: Secondary | ICD-10-CM | POA: Insufficient documentation

## 2014-12-28 LAB — POCT HEMOGLOBIN-HEMACUE: Hemoglobin: 12 g/dL — ABNORMAL LOW (ref 13.0–17.0)

## 2014-12-28 MED ORDER — DARBEPOETIN ALFA 100 MCG/0.5ML IJ SOSY: 100.0000 ug | PREFILLED_SYRINGE | INTRAMUSCULAR | Status: DC

## 2014-12-29 LAB — IRON AND TIBC
Iron: 36 ug/dL — ABNORMAL LOW (ref 42–165)
Saturation Ratios: 14 % — ABNORMAL LOW (ref 20–55)
TIBC: 251 ug/dL (ref 215–435)
UIBC: 215 ug/dL (ref 125–400)

## 2014-12-29 LAB — FERRITIN: FERRITIN: 477 ng/mL — AB (ref 22–322)

## 2015-01-11 ENCOUNTER — Encounter (HOSPITAL_COMMUNITY)
Admission: RE | Admit: 2015-01-11 | Discharge: 2015-01-11 | Disposition: A | Discharge status: Home / Self Care | Payer: Medicare Other | Source: Ambulatory Visit | Attending: Nephrology | Admitting: Nephrology

## 2015-01-11 DIAGNOSIS — D638 Anemia in other chronic diseases classified elsewhere: Secondary | ICD-10-CM | POA: Diagnosis not present

## 2015-01-11 LAB — POCT HEMOGLOBIN-HEMACUE: HEMOGLOBIN: 11 g/dL — AB (ref 13.0–17.0)

## 2015-01-11 MED ORDER — DARBEPOETIN ALFA 100 MCG/0.5ML IJ SOSY
100.0000 ug | PREFILLED_SYRINGE | INTRAMUSCULAR | Status: DC
Start: 2015-01-11 — End: 2015-01-12
  Administered 2015-01-11: 100 ug via SUBCUTANEOUS

## 2015-01-11 MED ORDER — DARBEPOETIN ALFA 100 MCG/0.5ML IJ SOSY
PREFILLED_SYRINGE | INTRAMUSCULAR | Status: AC
  Filled 2015-01-11: qty 0.5

## 2015-01-24 ENCOUNTER — Ambulatory Visit (HOSPITAL_COMMUNITY)
Admission: RE | Admit: 2015-01-24 | Discharge: 2015-01-24 | Disposition: A | Discharge status: Home / Self Care | Payer: Medicare Other | Source: Ambulatory Visit | Attending: Interventional Radiology | Admitting: Interventional Radiology

## 2015-01-24 ENCOUNTER — Other Ambulatory Visit: Payer: Self-pay | Admitting: Urology

## 2015-01-24 DIAGNOSIS — N1330 Unspecified hydronephrosis: Secondary | ICD-10-CM | POA: Diagnosis not present

## 2015-01-24 DIAGNOSIS — C679 Malignant neoplasm of bladder, unspecified: Secondary | ICD-10-CM | POA: Insufficient documentation

## 2015-01-24 DIAGNOSIS — N1339 Other hydronephrosis: Secondary | ICD-10-CM

## 2015-01-24 DIAGNOSIS — Z436 Encounter for attention to other artificial openings of urinary tract: Secondary | ICD-10-CM | POA: Insufficient documentation

## 2015-01-24 MED ORDER — LIDOCAINE HCL 1 % IJ SOLN
INTRAMUSCULAR | Status: AC
  Filled 2015-01-24: qty 20

## 2015-01-24 MED ORDER — IOHEXOL 300 MG/ML  SOLN
20.0000 mL | Freq: Once | INTRAMUSCULAR | Status: AC | PRN
  Administered 2015-01-24: 20 mL

## 2015-01-24 NOTE — Procedures (Signed)
Successful bilateral pcn exchgs No comp Stable F/u 6 weeks

## 2015-02-09 ENCOUNTER — Encounter (HOSPITAL_COMMUNITY)
Admission: RE | Admit: 2015-02-09 | Discharge: 2015-02-09 | Disposition: A | Discharge status: Home / Self Care | Payer: Medicare Other | Source: Ambulatory Visit | Attending: Nephrology | Admitting: Nephrology

## 2015-02-09 DIAGNOSIS — D638 Anemia in other chronic diseases classified elsewhere: Secondary | ICD-10-CM | POA: Insufficient documentation

## 2015-02-09 DIAGNOSIS — N185 Chronic kidney disease, stage 5: Secondary | ICD-10-CM | POA: Insufficient documentation

## 2015-02-09 LAB — POCT HEMOGLOBIN-HEMACUE: Hemoglobin: 11.1 g/dL — ABNORMAL LOW (ref 13.0–17.0)

## 2015-02-09 MED ORDER — DARBEPOETIN ALFA 100 MCG/0.5ML IJ SOSY
100.0000 ug | PREFILLED_SYRINGE | INTRAMUSCULAR | Status: DC
  Administered 2015-02-09: 100 ug via SUBCUTANEOUS

## 2015-02-09 MED ORDER — DARBEPOETIN ALFA 100 MCG/0.5ML IJ SOSY
PREFILLED_SYRINGE | INTRAMUSCULAR | Status: AC
  Filled 2015-02-09: qty 0.5

## 2015-02-10 LAB — IRON AND TIBC
Iron: <10 ug/dL — ABNORMAL LOW (ref 42–165)
UIBC: >575 ug/dL — ABNORMAL HIGH (ref 125–400)

## 2015-02-10 LAB — FERRITIN: FERRITIN: 418 ng/mL — AB (ref 22–322)

## 2015-02-14 ENCOUNTER — Other Ambulatory Visit (HOSPITAL_COMMUNITY): Payer: Self-pay | Admitting: *Deleted

## 2015-02-15 ENCOUNTER — Encounter (HOSPITAL_COMMUNITY)
Admission: RE | Admit: 2015-02-15 | Discharge: 2015-02-15 | Disposition: A | Discharge status: Home / Self Care | Payer: Medicare Other | Source: Ambulatory Visit | Attending: Nephrology | Admitting: Nephrology

## 2015-02-15 DIAGNOSIS — D638 Anemia in other chronic diseases classified elsewhere: Secondary | ICD-10-CM | POA: Diagnosis not present

## 2015-02-15 MED ORDER — SODIUM CHLORIDE 0.9 % IV SOLN
510.0000 mg | Freq: Once | INTRAVENOUS | Status: AC
  Administered 2015-02-15: 510 mg via INTRAVENOUS
  Filled 2015-02-15: qty 17

## 2015-02-21 ENCOUNTER — Other Ambulatory Visit: Payer: Self-pay | Admitting: Urology

## 2015-02-21 ENCOUNTER — Ambulatory Visit (HOSPITAL_COMMUNITY)
Admission: RE | Admit: 2015-02-21 | Discharge: 2015-02-21 | Disposition: A | Discharge status: Home / Self Care | Payer: Medicare Other | Source: Ambulatory Visit | Attending: Interventional Radiology | Admitting: Interventional Radiology

## 2015-02-21 DIAGNOSIS — N1330 Unspecified hydronephrosis: Secondary | ICD-10-CM | POA: Insufficient documentation

## 2015-02-21 DIAGNOSIS — Z436 Encounter for attention to other artificial openings of urinary tract: Secondary | ICD-10-CM | POA: Diagnosis not present

## 2015-02-21 MED ORDER — IOHEXOL 300 MG/ML  SOLN
50.0000 mL | Freq: Once | INTRAMUSCULAR | Status: AC | PRN
  Administered 2015-02-21: 10 mL

## 2015-03-06 ENCOUNTER — Other Ambulatory Visit: Payer: Self-pay | Admitting: Physician Assistant

## 2015-03-07 ENCOUNTER — Other Ambulatory Visit: Payer: Self-pay | Admitting: Urology

## 2015-03-07 ENCOUNTER — Other Ambulatory Visit (HOSPITAL_COMMUNITY): Payer: Self-pay | Admitting: Interventional Radiology

## 2015-03-07 ENCOUNTER — Ambulatory Visit (HOSPITAL_COMMUNITY)
Admission: RE | Admit: 2015-03-07 | Discharge: 2015-03-07 | Disposition: A | Discharge status: Home / Self Care | Payer: Medicare Other | Source: Ambulatory Visit | Attending: Urology | Admitting: Urology

## 2015-03-07 DIAGNOSIS — N1339 Other hydronephrosis: Secondary | ICD-10-CM

## 2015-03-07 DIAGNOSIS — D494 Neoplasm of unspecified behavior of bladder: Secondary | ICD-10-CM

## 2015-03-07 DIAGNOSIS — C679 Malignant neoplasm of bladder, unspecified: Secondary | ICD-10-CM | POA: Diagnosis not present

## 2015-03-07 DIAGNOSIS — N1330 Unspecified hydronephrosis: Secondary | ICD-10-CM | POA: Diagnosis not present

## 2015-03-07 DIAGNOSIS — Z436 Encounter for attention to other artificial openings of urinary tract: Secondary | ICD-10-CM | POA: Diagnosis not present

## 2015-03-07 MED ORDER — LIDOCAINE HCL 1 % IJ SOLN
INTRAMUSCULAR | Status: AC
  Filled 2015-03-07: qty 20

## 2015-03-07 MED ORDER — IOHEXOL 300 MG/ML  SOLN
50.0000 mL | Freq: Once | INTRAMUSCULAR | Status: AC | PRN
Start: 2015-03-07 — End: 2015-03-07
  Administered 2015-03-07: 10 mL

## 2015-03-07 NOTE — Procedures (Signed)
Successful bilateral pcn exchg No comp Stable

## 2015-03-09 ENCOUNTER — Encounter (HOSPITAL_COMMUNITY)
Admission: RE | Admit: 2015-03-09 | Discharge: 2015-03-09 | Disposition: A | Discharge status: Home / Self Care | Payer: Medicare Other | Source: Ambulatory Visit | Attending: Nephrology | Admitting: Nephrology

## 2015-03-09 DIAGNOSIS — N185 Chronic kidney disease, stage 5: Secondary | ICD-10-CM | POA: Insufficient documentation

## 2015-03-09 DIAGNOSIS — D638 Anemia in other chronic diseases classified elsewhere: Secondary | ICD-10-CM | POA: Insufficient documentation

## 2015-03-09 LAB — IRON AND TIBC
Iron: 46 ug/dL (ref 45–182)
Saturation Ratios: 19 % (ref 17.9–39.5)
TIBC: 246 ug/dL — ABNORMAL LOW (ref 250–450)
UIBC: 200 ug/dL

## 2015-03-09 LAB — FERRITIN: FERRITIN: 547 ng/mL — AB (ref 24–336)

## 2015-03-09 MED ORDER — DARBEPOETIN ALFA 100 MCG/0.5ML IJ SOSY
100.0000 ug | PREFILLED_SYRINGE | INTRAMUSCULAR | Status: DC
  Administered 2015-03-09: 100 ug via SUBCUTANEOUS

## 2015-03-09 MED ORDER — DARBEPOETIN ALFA 100 MCG/0.5ML IJ SOSY
PREFILLED_SYRINGE | INTRAMUSCULAR | Status: AC
  Filled 2015-03-09: qty 0.5

## 2015-03-10 LAB — POCT HEMOGLOBIN-HEMACUE: HEMOGLOBIN: 11.2 g/dL — AB (ref 13.0–17.0)

## 2015-03-16 ENCOUNTER — Encounter: Payer: Self-pay | Admitting: Cardiology

## 2015-03-16 ENCOUNTER — Ambulatory Visit (INDEPENDENT_AMBULATORY_CARE_PROVIDER_SITE_OTHER): Payer: Medicare Other | Admitting: Cardiology

## 2015-03-16 ENCOUNTER — Telehealth: Payer: Self-pay | Admitting: Cardiology

## 2015-03-16 VITALS — BP 150/71 | HR 70 | Ht 62.0 in | Wt 116.7 lb

## 2015-03-16 DIAGNOSIS — I25810 Atherosclerosis of coronary artery bypass graft(s) without angina pectoris: Secondary | ICD-10-CM

## 2015-03-16 DIAGNOSIS — N184 Chronic kidney disease, stage 4 (severe): Secondary | ICD-10-CM | POA: Diagnosis not present

## 2015-03-16 DIAGNOSIS — I498 Other specified cardiac arrhythmias: Secondary | ICD-10-CM

## 2015-03-16 DIAGNOSIS — I499 Cardiac arrhythmia, unspecified: Secondary | ICD-10-CM | POA: Diagnosis not present

## 2015-03-16 NOTE — Patient Instructions (Signed)
Continue your current therapy  I will see you in one year   

## 2015-03-16 NOTE — Progress Notes (Signed)
Benjamin Macdonald Date of Birth: [birth year] Medical Record #[id number]  History of Present Illness: Benjamin Macdonald is seen today for  follow up CAD with remote CABG. He is now 81 yo. He underwent bladder surgery in December for cancer. Tolerated this well. No RT or chemo.  He states he is feeling great. He still enjoys doing his yard work. He denies any dizziness, palpitations, syncope, chest pain, or shortness of breath. He does have chronic LE edema R>L. Daughter reports renal function returned to baseline following surgery.  Current Outpatient Prescriptions on File Prior to Visit  Medication Sig Dispense Refill  . furosemide (LASIX) 80 MG tablet Take 120 mg by mouth 2 (two) times daily.     Marland Kitchen gabapentin (NEURONTIN) 100 MG capsule Take 100 mg by mouth every morning.     . iron polysaccharides (NIFEREX) 150 MG capsule Take 150 mg by mouth 2 (two) times daily.    Marland Kitchen lidocaine (LIDODERM) 5 % Place 1 patch onto the skin every 12 (twelve) hours as needed (for pain). Remove & Discard patch within 12 hours or as directed by MD    . methadone (DOLOPHINE) 5 MG tablet Take 5 mg by mouth every 6 (six) hours.     . Multiple Vitamins-Minerals (MULTIVITAMINS THER. W/MINERALS) TABS Take 1 tablet by mouth every morning.     Marland Kitchen oxybutynin (DITROPAN) 5 MG tablet Take 1 tablet (5 mg total) by mouth every 8 (eight) hours as needed for bladder spasms. 20 tablet 0  . simvastatin (ZOCOR) 40 MG tablet Take 40 mg by mouth every evening.     No current facility-administered medications on file prior to visit.    Allergies  Allergen Reactions  . Aspirin Swelling    Swelling of the face   . Ciprofloxacin Diarrhea  . Doxycycline Diarrhea and Nausea And Vomiting  . Amoxicillin Rash  . Cephalexin Rash    Past Medical History  Diagnosis Date  . CKD (chronic kidney disease), stage IV   . Hypertension   . Neuropathy   . Cancer 2002    prostate sp radiation  . Dehydration   . Weight loss   . Failure to thrive in  childhood     In Adult  . Anemia   . History of TIAs   . Coronary artery disease     CABG May of 1991 six bypass  . Anginal pain     hx of  prior to CABG  . Stroke     hx of TIA's times 3  . BPH (benign prostatic hyperplasia)   . Lower extremity edema   . Cellulitis of right leg     hx of treated with clindamycin   . Neuromuscular disorder     peripheral neuropathy  . Dysrhythmia     hx of atrial arrhythymia per [facility] history 09/06/2014   . Chronic pain   . Urinary incontinence   . Hematuria     hx of per [facility] history 09/06/2014  . Depression     hx of per [facility] history 09/06/2014   . Secondary hyperparathyroidism     per [facility] history 09/06/2014   . Rectal incontinence     hx of per [facility] history 09/06/2014   . Hearing loss   . Arteriovenous fistula     left brachiocephalic     Past Surgical History  Procedure Laterality Date  . Coronary artery bypass graft      1992  . Dg  av dialysis  shunt access exist*l* or      left arm   . Total hip arthroplasty      right   . Transurethral resection of prostate      1989; [facility] 4-5/10 external beam radiotherapy completed 03/14/1989  . Tonsillectomy    . Colonscopy       polyps removed  . Eye surgery      cataract removed per left eye   . Hernia repair      inguinal bilateral   . Transurethral resection of bladder tumor with gyrus (turbt-gyrus) N/A 09/26/2014    Procedure: TRANSURETHRAL RESECTION OF BLADDER  WITH GYRUS (TURBT-GYRUS), bladder biopsy;  Surgeon: Benjamin Loa, MD;  Location: [facility];  Service: Urology;  Laterality: N/A;    History  Smoking status  . Former Smoker  Smokeless tobacco  . Never Used    History  Alcohol Use No    Family History  Problem Relation Age of Onset  . Heart disease Mother   . Heart disease Brother     Review of Systems: As noted in history of present illness. All other systems were reviewed and are  negative.  Physical Exam: BP 150/71 mmHg  Pulse 70  Ht 5\' 2"  (1.575 m)  Wt 52.935 kg (116 lb 11.2 oz)  BMI 21.34 kg/m2 He is an elderly, thin, white male in no acute distress. HEENT is normal.  Neck is supple without JVD, adenopathy, thyromegaly, or bruits. Lungs are clear. Cardiac exam reveals an irregular rhythm. There is a soft grade 1/6 systolic murmur at the left sternal border. Abdomen is soft and nontender without masses or bruits. Extremities reveal 2+ RLE edema below the knee. 1+ on the left. . His skin is dry and scaly. He is alert and oriented x3. Cranial nerves II through XII are intact.  LABORATORY DATA:   Assessment / Plan: 1. History of atrial arrhythmia with wandering atrial pacemaker and PACs. Asymptomatic.  2. CAD with remote CABG- continue Plavix since he is ASA allergic. On statin. No symptoms.   3. HTN controlled on nifedipine  4. CKD.  Follow up in one year

## 2015-03-16 NOTE — Telephone Encounter (Signed)
Returned call to patient's wife.I changed patient's pharmacy to [facility] on [address].

## 2015-03-16 NOTE — Telephone Encounter (Signed)
FYI:  Pt's daughter called in wanting to let Dr. Doug Sou nurse know that his new pharmacy is the [facility] on [address]. Please call  Thanks

## 2015-04-02 ENCOUNTER — Observation Stay (HOSPITAL_COMMUNITY)
Admission: EM | Admit: 2015-04-02 | Discharge: 2015-04-05 | Disposition: A | Discharge status: Home / Self Care | Payer: Medicare Other | Attending: Internal Medicine | Admitting: Internal Medicine

## 2015-04-02 ENCOUNTER — Emergency Department (HOSPITAL_COMMUNITY): Payer: Medicare Other

## 2015-04-02 ENCOUNTER — Other Ambulatory Visit (HOSPITAL_COMMUNITY): Payer: Self-pay

## 2015-04-02 ENCOUNTER — Encounter (HOSPITAL_COMMUNITY): Payer: Self-pay | Admitting: Emergency Medicine

## 2015-04-02 DIAGNOSIS — I4510 Unspecified right bundle-branch block: Secondary | ICD-10-CM | POA: Diagnosis not present

## 2015-04-02 DIAGNOSIS — Z8673 Personal history of transient ischemic attack (TIA), and cerebral infarction without residual deficits: Secondary | ICD-10-CM | POA: Insufficient documentation

## 2015-04-02 DIAGNOSIS — Z87891 Personal history of nicotine dependence: Secondary | ICD-10-CM | POA: Diagnosis not present

## 2015-04-02 DIAGNOSIS — G4701 Insomnia due to medical condition: Secondary | ICD-10-CM | POA: Diagnosis not present

## 2015-04-02 DIAGNOSIS — I2510 Atherosclerotic heart disease of native coronary artery without angina pectoris: Secondary | ICD-10-CM | POA: Insufficient documentation

## 2015-04-02 DIAGNOSIS — J189 Pneumonia, unspecified organism: Secondary | ICD-10-CM | POA: Diagnosis present

## 2015-04-02 DIAGNOSIS — H9190 Unspecified hearing loss, unspecified ear: Secondary | ICD-10-CM

## 2015-04-02 DIAGNOSIS — N2581 Secondary hyperparathyroidism of renal origin: Secondary | ICD-10-CM | POA: Diagnosis not present

## 2015-04-02 DIAGNOSIS — Z951 Presence of aortocoronary bypass graft: Secondary | ICD-10-CM | POA: Diagnosis not present

## 2015-04-02 DIAGNOSIS — I4891 Unspecified atrial fibrillation: Secondary | ICD-10-CM | POA: Diagnosis present

## 2015-04-02 DIAGNOSIS — G894 Chronic pain syndrome: Secondary | ICD-10-CM | POA: Insufficient documentation

## 2015-04-02 DIAGNOSIS — I129 Hypertensive chronic kidney disease with stage 1 through stage 4 chronic kidney disease, or unspecified chronic kidney disease: Secondary | ICD-10-CM | POA: Diagnosis not present

## 2015-04-02 DIAGNOSIS — C679 Malignant neoplasm of bladder, unspecified: Secondary | ICD-10-CM | POA: Diagnosis present

## 2015-04-02 DIAGNOSIS — Z881 Allergy status to other antibiotic agents status: Secondary | ICD-10-CM | POA: Insufficient documentation

## 2015-04-02 DIAGNOSIS — F329 Major depressive disorder, single episode, unspecified: Secondary | ICD-10-CM | POA: Diagnosis not present

## 2015-04-02 DIAGNOSIS — R531 Weakness: Secondary | ICD-10-CM | POA: Diagnosis not present

## 2015-04-02 DIAGNOSIS — E860 Dehydration: Secondary | ICD-10-CM | POA: Diagnosis not present

## 2015-04-02 DIAGNOSIS — N184 Chronic kidney disease, stage 4 (severe): Secondary | ICD-10-CM | POA: Insufficient documentation

## 2015-04-02 DIAGNOSIS — G629 Polyneuropathy, unspecified: Secondary | ICD-10-CM | POA: Diagnosis not present

## 2015-04-02 DIAGNOSIS — N400 Enlarged prostate without lower urinary tract symptoms: Secondary | ICD-10-CM | POA: Diagnosis not present

## 2015-04-02 DIAGNOSIS — R748 Abnormal levels of other serum enzymes: Secondary | ICD-10-CM | POA: Insufficient documentation

## 2015-04-02 DIAGNOSIS — R9389 Abnormal findings on diagnostic imaging of other specified body structures: Secondary | ICD-10-CM

## 2015-04-02 DIAGNOSIS — Z886 Allergy status to analgesic agent status: Secondary | ICD-10-CM | POA: Insufficient documentation

## 2015-04-02 DIAGNOSIS — E43 Unspecified severe protein-calorie malnutrition: Secondary | ICD-10-CM | POA: Diagnosis not present

## 2015-04-02 DIAGNOSIS — Z888 Allergy status to other drugs, medicaments and biological substances status: Secondary | ICD-10-CM

## 2015-04-02 DIAGNOSIS — Z8551 Personal history of malignant neoplasm of bladder: Secondary | ICD-10-CM | POA: Insufficient documentation

## 2015-04-02 DIAGNOSIS — R5383 Other fatigue: Secondary | ICD-10-CM | POA: Diagnosis not present

## 2015-04-02 DIAGNOSIS — I10 Essential (primary) hypertension: Secondary | ICD-10-CM | POA: Diagnosis present

## 2015-04-02 LAB — TROPONIN I
Troponin I: 0.08 ng/mL — ABNORMAL HIGH (ref <0.031)
Troponin I: 0.08 ng/mL — ABNORMAL HIGH (ref <0.031)
Troponin I: 0.08 ng/mL — ABNORMAL HIGH (ref <0.031)

## 2015-04-02 LAB — COMPREHENSIVE METABOLIC PANEL
ALBUMIN: 3.2 g/dL — AB (ref 3.5–5.0)
ALT: 17 U/L (ref 17–63)
ANION GAP: 10 (ref 5–15)
AST: 22 U/L (ref 15–41)
Alkaline Phosphatase: 54 U/L (ref 38–126)
BUN: 70 mg/dL — ABNORMAL HIGH (ref 6–20)
CALCIUM: 9.1 mg/dL (ref 8.9–10.3)
CO2: 26 mmol/L (ref 22–32)
Chloride: 105 mmol/L (ref 101–111)
Creatinine, Ser: 3.65 mg/dL — ABNORMAL HIGH (ref 0.61–1.24)
GFR calc Af Amer: 15 mL/min — ABNORMAL LOW (ref >60)
GFR calc non Af Amer: 13 mL/min — ABNORMAL LOW (ref >60)
Glucose, Bld: 109 mg/dL — ABNORMAL HIGH (ref 65–99)
POTASSIUM: 3.9 mmol/L (ref 3.5–5.1)
Sodium: 141 mmol/L (ref 135–145)
Total Bilirubin: 0.8 mg/dL (ref 0.3–1.2)
Total Protein: 6 g/dL — ABNORMAL LOW (ref 6.5–8.1)

## 2015-04-02 LAB — URINALYSIS, ROUTINE W REFLEX MICROSCOPIC
Bilirubin Urine: NEGATIVE
GLUCOSE, UA: NEGATIVE mg/dL
Ketones, ur: NEGATIVE mg/dL
Nitrite: NEGATIVE
Protein, ur: 100 mg/dL — AB
Specific Gravity, Urine: 1.015 (ref 1.005–1.030)
Urobilinogen, UA: 0.2 mg/dL (ref 0.0–1.0)
pH: 8.5 — ABNORMAL HIGH (ref 5.0–8.0)

## 2015-04-02 LAB — CBC WITH DIFFERENTIAL/PLATELET
Basophils Absolute: 0 10*3/uL (ref 0.0–0.1)
Basophils Relative: 0 % (ref 0–1)
EOS PCT: 0 % (ref 0–5)
Eosinophils Absolute: 0 10*3/uL (ref 0.0–0.7)
HCT: 38.8 % — ABNORMAL LOW (ref 39.0–52.0)
HEMOGLOBIN: 11.9 g/dL — AB (ref 13.0–17.0)
LYMPHS PCT: 10 % — AB (ref 12–46)
Lymphs Abs: 1 10*3/uL (ref 0.7–4.0)
MCH: 30.9 pg (ref 26.0–34.0)
MCHC: 30.7 g/dL (ref 30.0–36.0)
MCV: 100.8 fL — ABNORMAL HIGH (ref 78.0–100.0)
MONO ABS: 0.9 10*3/uL (ref 0.1–1.0)
Monocytes Relative: 8 % (ref 3–12)
Neutro Abs: 8.7 10*3/uL — ABNORMAL HIGH (ref 1.7–7.7)
Neutrophils Relative %: 82 % — ABNORMAL HIGH (ref 43–77)
Platelets: 281 10*3/uL (ref 150–400)
RBC: 3.85 MIL/uL — ABNORMAL LOW (ref 4.22–5.81)
RDW: 15.5 % (ref 11.5–15.5)
WBC: 10.7 10*3/uL — ABNORMAL HIGH (ref 4.0–10.5)

## 2015-04-02 LAB — URINE MICROSCOPIC-ADD ON

## 2015-04-02 LAB — BRAIN NATRIURETIC PEPTIDE: B Natriuretic Peptide: 400.8 pg/mL — ABNORMAL HIGH (ref 0.0–100.0)

## 2015-04-02 MED ORDER — METHADONE HCL 5 MG PO TABS
5.0000 mg | ORAL_TABLET | Freq: Four times a day (QID) | ORAL | Status: DC
  Administered 2015-04-02 – 2015-04-04 (×7): 5 mg via ORAL
  Filled 2015-04-02 (×7): qty 1

## 2015-04-02 MED ORDER — ENSURE ENLIVE PO LIQD
237.0000 mL | Freq: Two times a day (BID) | ORAL | Status: DC
  Administered 2015-04-03 – 2015-04-05 (×4): 237 mL via ORAL

## 2015-04-02 MED ORDER — TRAMADOL HCL 50 MG PO TABS
50.0000 mg | ORAL_TABLET | Freq: Two times a day (BID) | ORAL | Status: DC | PRN
  Administered 2015-04-05: 50 mg via ORAL
  Filled 2015-04-02: qty 1

## 2015-04-02 MED ORDER — METOPROLOL TARTRATE 12.5 MG HALF TABLET
12.5000 mg | ORAL_TABLET | Freq: Two times a day (BID) | ORAL | Status: DC
Start: 2015-04-02 — End: 2015-04-04
  Administered 2015-04-02 – 2015-04-03 (×3): 12.5 mg via ORAL
  Filled 2015-04-02 (×5): qty 1

## 2015-04-02 MED ORDER — NIFEDIPINE ER 30 MG PO TB24: 30.0000 mg | ORAL_TABLET | Freq: Every morning | ORAL | Status: DC

## 2015-04-02 MED ORDER — CLOPIDOGREL BISULFATE 75 MG PO TABS
75.0000 mg | ORAL_TABLET | Freq: Every morning | ORAL | Status: DC
  Administered 2015-04-03 – 2015-04-04 (×2): 75 mg via ORAL
  Filled 2015-04-02 (×2): qty 1

## 2015-04-02 MED ORDER — GABAPENTIN 100 MG PO CAPS: 200.0000 mg | ORAL_CAPSULE | Freq: Four times a day (QID) | ORAL | Status: DC

## 2015-04-02 MED ORDER — ALPRAZOLAM 0.5 MG PO TABS
0.5000 mg | ORAL_TABLET | Freq: Three times a day (TID) | ORAL | Status: DC | PRN
  Administered 2015-04-02 – 2015-04-03 (×2): 0.5 mg via ORAL
  Filled 2015-04-02 (×2): qty 1

## 2015-04-02 MED ORDER — GABAPENTIN 100 MG PO CAPS
100.0000 mg | ORAL_CAPSULE | Freq: Four times a day (QID) | ORAL | Status: DC
  Filled 2015-04-02 (×2): qty 1

## 2015-04-02 MED ORDER — HYDRALAZINE HCL 20 MG/ML IJ SOLN
2.0000 mg | Freq: Four times a day (QID) | INTRAMUSCULAR | Status: DC | PRN
  Administered 2015-04-04: 2 mg via INTRAVENOUS
  Filled 2015-04-02: qty 1

## 2015-04-02 MED ORDER — GABAPENTIN 100 MG PO CAPS
100.0000 mg | ORAL_CAPSULE | Freq: Four times a day (QID) | ORAL | Status: DC
  Administered 2015-04-02 – 2015-04-05 (×8): 100 mg via ORAL
  Filled 2015-04-02 (×14): qty 1

## 2015-04-02 MED ORDER — TRAMADOL HCL 50 MG PO TABS: 50.0000 mg | ORAL_TABLET | Freq: Four times a day (QID) | ORAL | Status: DC | PRN

## 2015-04-02 MED ORDER — ENOXAPARIN SODIUM 30 MG/0.3ML ~~LOC~~ SOLN
30.0000 mg | SUBCUTANEOUS | Status: AC
  Administered 2015-04-02 – 2015-04-04 (×3): 30 mg via SUBCUTANEOUS
  Filled 2015-04-02 (×3): qty 0.3

## 2015-04-02 MED ORDER — CLONAZEPAM 0.5 MG PO TABS: 0.2500 mg | ORAL_TABLET | Freq: Two times a day (BID) | ORAL | Status: DC | PRN

## 2015-04-02 NOTE — ED Provider Notes (Signed)
CSN: [id number]     Arrival date & time 04/02/15  9449 History   First MD Initiated Contact with Patient 04/02/15 [phone]     Chief Complaint  Patient presents with  . Weakness     (Consider location/radiation/quality/duration/timing/severity/associated sxs/prior Treatment) Patient is a 81 y.o. male presenting with weakness.  Weakness This is a new problem. Episode onset: 3-4 days ago. The problem occurs constantly. The problem has been gradually worsening. Pertinent negatives include no chest pain, no abdominal pain and no shortness of breath. Associated symptoms comments: Insomnia, chronic leg pain. Nothing aggravates the symptoms. Nothing relieves the symptoms. Treatments tried: prescribed xanax a few days ago by PCP.  It has not helped.    Past Medical History  Diagnosis Date  . CKD (chronic kidney disease), stage IV   . Hypertension   . Neuropathy   . Cancer 2002    prostate sp radiation  . Dehydration   . Weight loss   . Failure to thrive in childhood     In Adult  . Anemia   . History of TIAs   . Coronary artery disease     CABG May of 1991 six bypass  . Anginal pain     hx of  prior to CABG  . Stroke     hx of TIA's times 3  . BPH (benign prostatic hyperplasia)   . Lower extremity edema   . Cellulitis of right leg     hx of treated with clindamycin   . Neuromuscular disorder     peripheral neuropathy  . Dysrhythmia     hx of atrial arrhythymia per [facility] history 09/06/2014   . Chronic pain   . Urinary incontinence   . Hematuria     hx of per [facility] history 09/06/2014  . Depression     hx of per [facility] history 09/06/2014   . Secondary hyperparathyroidism     per [facility] history 09/06/2014   . Rectal incontinence     hx of per [facility] history 09/06/2014   . Hearing loss   . Arteriovenous fistula     left brachiocephalic    Past Surgical History  Procedure Laterality Date  . Coronary artery bypass graft      1992   . Dg av dialysis  shunt access exist*l* or      left arm   . Total hip arthroplasty      right   . Transurethral resection of prostate      1989; [facility] 4-5/10 external beam radiotherapy completed 03/14/1989  . Tonsillectomy    . Colonscopy       polyps removed  . Eye surgery      cataract removed per left eye   . Hernia repair      inguinal bilateral   . Transurethral resection of bladder tumor with gyrus (turbt-gyrus) N/A 09/26/2014    Procedure: TRANSURETHRAL RESECTION OF BLADDER  WITH GYRUS (TURBT-GYRUS), bladder biopsy;  Surgeon: Jorja Loa, MD;  Location: [facility];  Service: Urology;  Laterality: N/A;   Family History  Problem Relation Age of Onset  . Heart disease Mother   . Heart disease Brother    History  Substance Use Topics  . Smoking status: Former Research scientist (life sciences)  . Smokeless tobacco: Never Used  . Alcohol Use: No    Review of Systems  Constitutional: Negative for fever.  HENT: Negative for congestion.   Respiratory: Negative for cough and shortness of breath.  Cardiovascular: Negative for chest pain.  Gastrointestinal: Negative for nausea, vomiting, abdominal pain and diarrhea.  Neurological: Positive for weakness.  All other systems reviewed and are negative.     Allergies  Aspirin; Ciprofloxacin; Doxycycline; Amoxicillin; and Cephalexin  Home Medications   Prior to Admission medications   Medication Sig Start Date End Date Taking? Authorizing Provider  clindamycin (CLEOCIN) 300 MG capsule Take 300 mg by mouth 3 (three) times daily. 03/08/15   Historical Provider, MD  clopidogrel (PLAVIX) 75 MG tablet Take 75 mg by mouth daily. 12/15/14   Historical Provider, MD  furosemide (LASIX) 80 MG tablet Take 120 mg by mouth 2 (two) times daily.     Historical Provider, MD  gabapentin (NEURONTIN) 100 MG capsule Take 100 mg by mouth every morning.     Historical Provider, MD  iron polysaccharides (NIFEREX) 150 MG capsule Take 150 mg by mouth 2 (two) times daily.     Historical Provider, MD  lidocaine (LIDODERM) 5 % Place 1 patch onto the skin every 12 (twelve) hours as needed (for pain). Remove & Discard patch within 12 hours or as directed by MD    Historical Provider, MD  methadone (DOLOPHINE) 5 MG tablet Take 5 mg by mouth every 6 (six) hours.     Historical Provider, MD  Multiple Vitamins-Minerals (MULTIVITAMINS THER. W/MINERALS) TABS Take 1 tablet by mouth every morning.     Historical Provider, MD  NIFEdipine (PROCARDIA-XL/ADALAT-CC/NIFEDICAL-XL) 30 MG 24 hr tablet Take 30 mg by mouth 2 (two) times daily. 01/23/15   Historical Provider, MD  oxybutynin (DITROPAN) 5 MG tablet Take 1 tablet (5 mg total) by mouth every 8 (eight) hours as needed for bladder spasms. 09/26/14   Franchot Gallo, MD  simvastatin (ZOCOR) 40 MG tablet Take 40 mg by mouth every evening.    Historical Provider, MD   BP 143/79 mmHg  Pulse 107  Resp 20  SpO2 100% Physical Exam  Constitutional: He is oriented to person, place, and time. He appears well-developed and well-nourished. No distress.  Elderly  HENT:  Head: Normocephalic and atraumatic.  Mouth/Throat: Oropharynx is clear and moist.  Eyes: Conjunctivae are normal. Pupils are equal, round, and reactive to light. No scleral icterus.  Neck: Neck supple.  Cardiovascular: Normal rate and intact distal pulses.  An irregularly irregular rhythm present.  Murmur heard.  Systolic murmur is present with a grade of 2/6  Pulmonary/Chest: Effort normal and breath sounds normal. No stridor. No respiratory distress. He has no wheezes. He has no rales.  Abdominal: Soft. He exhibits no distension. There is no tenderness. There is no rebound and no guarding.  Musculoskeletal: Normal range of motion. He exhibits no edema.  Neurological: He is alert and oriented to person, place, and time.  Skin: Skin is warm and dry. No rash noted.  Psychiatric: He has a normal mood and affect. His behavior is normal.  Nursing note and vitals  reviewed.   ED Course  Procedures (including critical care time) Labs Review Labs Reviewed  CBC WITH DIFFERENTIAL/PLATELET - Abnormal; Notable for the following:    WBC 10.7 (*)    RBC 3.85 (*)    Hemoglobin 11.9 (*)    HCT 38.8 (*)    MCV 100.8 (*)    Neutrophils Relative % 82 (*)    Neutro Abs 8.7 (*)    Lymphocytes Relative 10 (*)    All other components within normal limits  COMPREHENSIVE METABOLIC PANEL - Abnormal; Notable for the following:  Glucose, Bld 109 (*)    BUN 70 (*)    Creatinine, Ser 3.65 (*)    Total Protein 6.0 (*)    Albumin 3.2 (*)    GFR calc non Af Amer 13 (*)    GFR calc Af Amer 15 (*)    All other components within normal limits  URINALYSIS, ROUTINE W REFLEX MICROSCOPIC (NOT AT [facility]) - Abnormal; Notable for the following:    APPearance CLOUDY (*)    pH 8.5 (*)    Hgb urine dipstick SMALL (*)    Protein, ur 100 (*)    Leukocytes, UA LARGE (*)    All other components within normal limits  TROPONIN I - Abnormal; Notable for the following:    Troponin I 0.08 (*)    All other components within normal limits  BRAIN NATRIURETIC PEPTIDE - Abnormal; Notable for the following:    B Natriuretic Peptide 400.8 (*)    All other components within normal limits  URINE MICROSCOPIC-ADD ON - Abnormal; Notable for the following:    Squamous Epithelial / LPF FEW (*)    Bacteria, UA MANY (*)    Casts HYALINE CASTS (*)    Crystals TRIPLE PHOSPHATE CRYSTALS (*)    All other components within normal limits  TROPONIN I  TROPONIN I  TROPONIN I    Imaging Review Dg Chest 2 View  04/02/2015   CLINICAL DATA:  81 year old male presenting with weakness and fatigue. Clinical history of bladder cancer.  EXAM: CHEST  2 VIEW  COMPARISON:  Prior chest x-ray 09/20/2014  FINDINGS: Low inspiratory volumes with nonspecific right basilar opacity. Increased AP diameter the chest with prominent retrosternal clear space and diffuse interstitial prominence and bronchial cuffing  suggests underlying COPD. Stable cardiomegaly. Patient is status post median sternotomy with evidence of prior multivessel CABG. Atherosclerotic calcifications noted throughout the transverse aorta. No pleural effusion, pneumothorax or evidence of pulmonary edema. No acute osseous abnormality.  IMPRESSION: 1. Nonspecific right lower lobe patchy airspace opacity. In the appropriate clinical setting, this could represent bronchopneumonia. Otherwise, subsegmental atelectasis superimposed on underlying chronic lung changes is a possibility. 2. Stable cardiomegaly and chronic parenchymal changes suggestive of underlying COPD.   Electronically Signed   By: Jacqulynn Cadet M.D.   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  MDM   Final diagnoses:  Fatigue    81 yo male with generalized weakness.  He states he was unable to get out of bed this morning.  Of note, he was recently diagnosed with a fib by his PCP and has been referred back to his cardiologist.    Labs notable for mildly elevated troponin.  No chest pain or SOB to suggest acute MI.  Anaphylactic allergy to aspirin.  Troponin elevation possibly secondary to new a fib.  CXR also showed questionable opacity.  He has had no fevers, cough, or SOB to suggest PNA.   Plan admit for further workup.   Serita Grit, MD 04/02/15 [phone]

## 2015-04-02 NOTE — ED Notes (Signed)
Patient transported to X-ray 

## 2015-04-02 NOTE — ED Notes (Signed)
MD at bedside. 

## 2015-04-02 NOTE — ED Notes (Signed)
Bed: EL95 Expected date:  Expected time:  Means of arrival:  Comments: Ems-WEAKNESS

## 2015-04-02 NOTE — H&P (Signed)
[facility] History and Physical  Benjamin Macdonald DGL:[id number] DOB: [birth year] DOA: 04/02/2015  Referring physician: EDP PCP:  Melinda Crutch, MD   Chief Complaint: camein for generalized weakness.   HPI: Benjamin Macdonald is a 81 y.o. male with h/o stage 4 CKD, hypertension, dehydration, CAD, s/p CABG, cva, NEWLY diagnosed atrial fibrillation reports generalized weakness for a few days. He was seen in the PCP office and was diagnosed with atrial fibrillation. But as his symptoms were persistent, he presented to ED. On arrival to ED, he was afebrile, denies any cough of sob, or chest pain. His CXR revealed some right lower lobe opacity. Lab work revealed mild leukocytosis and creatinine of 3.65. He was referred to medical service for evaluation of generalized weakness. On further talking to the patient and family, theyr reported the weakness started after he was put on lidocaine patch and klonipin. We have stopped klonipin. Lidocaine patch was taken out.    Review of Systems:  Constitutional:  Generalized weakness.  HEENT:  No headaches, Difficulty swallowing,Tooth/dental problems,Sore throat,  No sneezing, itching, ear ache, nasal congestion, post nasal drip,  Cardio-vascular:  No chest pain, Orthopnea, PND, swelling in lower extremities, anasarca, dizziness, palpitations  GI:  No heartburn, indigestion, abdominal pain, nausea, vomiting, diarrhea, change in bowel habits, loss of appetite  Resp:  No shortness of breath with exertion or at rest. No excess mucus, no productive cough, No non-productive cough, No coughing up of blood.No change in color of mucus.No wheezing.No chest wall deformity  Skin:  no rash or lesions.  GU:  no dysuria, change in color of urine, no urgency or frequency. No flank pain.  Musculoskeletal:  No joint pain or swelling. No decreased range of motion. No back pain.    Past Medical History  Diagnosis Date  . CKD (chronic kidney disease), stage IV   .  Hypertension   . Neuropathy   . Cancer 2002    prostate sp radiation  . Dehydration   . Weight loss   . Failure to thrive in childhood     In Adult  . Anemia   . History of TIAs   . Coronary artery disease     CABG May of 1991 six bypass  . Anginal pain     hx of  prior to CABG  . Stroke     hx of TIA's times 3  . BPH (benign prostatic hyperplasia)   . Lower extremity edema   . Cellulitis of right leg     hx of treated with clindamycin   . Neuromuscular disorder     peripheral neuropathy  . Dysrhythmia     hx of atrial arrhythymia per [facility] history 09/06/2014   . Chronic pain   . Urinary incontinence   . Hematuria     hx of per [facility] history 09/06/2014  . Depression     hx of per [facility] history 09/06/2014   . Secondary hyperparathyroidism     per [facility] history 09/06/2014   . Rectal incontinence     hx of per [facility] history 09/06/2014   . Hearing loss   . Arteriovenous fistula     left brachiocephalic    Past Surgical History  Procedure Laterality Date  . Coronary artery bypass graft      1992  . Dg av dialysis  shunt access exist*l* or      left arm   . Total hip arthroplasty  right   . Transurethral resection of prostate      1989; [facility] 4-5/10 external beam radiotherapy completed 03/14/1989  . Tonsillectomy    . Colonscopy       polyps removed  . Eye surgery      cataract removed per left eye   . Hernia repair      inguinal bilateral   . Transurethral resection of bladder tumor with gyrus (turbt-gyrus) N/A 09/26/2014    Procedure: TRANSURETHRAL RESECTION OF BLADDER  WITH GYRUS (TURBT-GYRUS), bladder biopsy;  Surgeon: Jorja Loa, MD;  Location: [facility];  Service: Urology;  Laterality: N/A;   Social History:  reports that he has quit smoking. He has never used smokeless tobacco. He reports that he does not drink alcohol or use illicit drugs.  Allergies  Allergen Reactions  . Aspirin Anaphylaxis      Swelling of the face   . Ciprofloxacin Diarrhea  . Doxycycline Diarrhea and Nausea And Vomiting  . Amoxicillin Rash  . Cephalexin Rash    Family History  Problem Relation Age of Onset  . Heart disease Mother   . Heart disease Brother     do not leave blank  Prior to Admission medications   Medication Sig Start Date End Date Taking? Authorizing Provider  clonazePAM (KLONOPIN) 0.5 MG tablet Take 0.25-0.5 mg by mouth 2 (two) times daily as needed for anxiety.   Yes Historical Provider, MD  clopidogrel (PLAVIX) 75 MG tablet Take 75 mg by mouth every morning.  12/15/14  Yes Historical Provider, MD  furosemide (LASIX) 80 MG tablet Take 120 mg by mouth daily.    Yes Historical Provider, MD  gabapentin (NEURONTIN) 100 MG capsule Take 100 mg by mouth 4 (four) times daily.    Yes Historical Provider, MD  lidocaine (LIDODERM) 5 % Place 1 patch onto the skin every 12 (twelve) hours as needed (for pain). Remove & Discard patch within 12 hours or as directed by MD   Yes Historical Provider, MD  methadone (DOLOPHINE) 5 MG tablet Take 5 mg by mouth every 6 (six) hours.    Yes Historical Provider, MD  Multiple Vitamins-Minerals (MULTIVITAMINS THER. W/MINERALS) TABS Take 1 tablet by mouth every morning.    Yes Historical Provider, MD  NIFEdipine (PROCARDIA-XL/ADALAT-CC/NIFEDICAL-XL) 30 MG 24 hr tablet Take 30 mg by mouth every morning.  01/23/15  Yes Historical Provider, MD  simvastatin (ZOCOR) 80 MG tablet Take 40 mg by mouth every morning.   Yes Historical Provider, MD   Physical Exam: Filed Vitals:   04/02/15 0917 04/02/15 0930 04/02/15 1135 04/02/15 1500  BP: 143/79 117/62 149/89 168/89  Pulse: 107 102 105 102  Temp:   98 F (36.7 C) 98.6 F (37 C)  TempSrc: Oral  Oral Oral  Resp: 20 15 17 18   Height:    5\' 2"  (1.575 m)  Weight:    52.9 kg (116 lb 10 oz)  SpO2: 100% 98% 100% 99%    Wt Readings from Last 3 Encounters:  04/02/15 52.9 kg (116 lb 10 oz)  03/16/15 52.935 kg (116 lb 11.2 oz)   02/15/15 54.432 kg (120 lb)    General:  Appears calm and comfortable Eyes: PERRL, normal lids, irises & conjunctiva Neck: no LAD, masses or thyromegaly Cardiovascular:irregularly irregular. , no m/r/g. No LE edema Respiratory: CTA bilaterally, no w/r/r. Normal respiratory effort. Abdomen: soft, ntnd Skin: bruise over the left knee.  Musculoskeletal: grossly normal tone BUE/BL Neurologic: grossly non-focal.  Labs on Admission:  Basic Metabolic Panel:  Recent Labs Lab 04/02/15 0950  NA 141  K 3.9  CL 105  CO2 26  GLUCOSE 109*  BUN 70*  CREATININE 3.65*  CALCIUM 9.1   Liver Function Tests:  Recent Labs Lab 04/02/15 0950  AST 22  ALT 17  ALKPHOS 54  BILITOT 0.8  PROT 6.0*  ALBUMIN 3.2*   No results for input(s): LIPASE, AMYLASE in the last 168 hours. No results for input(s): AMMONIA in the last 168 hours. CBC:  Recent Labs Lab 04/02/15 0950  WBC 10.7*  NEUTROABS 8.7*  HGB 11.9*  HCT 38.8*  MCV 100.8*  PLT 281   Cardiac Enzymes:  Recent Labs Lab 04/02/15 0950  TROPONINI 0.08*    BNP (last 3 results)  Recent Labs  04/02/15 0950  BNP 400.8*    ProBNP (last 3 results) No results for input(s): PROBNP in the last 8760 hours.  CBG: No results for input(s): GLUCAP in the last 168 hours.  Radiological Exams on Admission: Dg Chest 2 View  04/02/2015   CLINICAL DATA:  81 year old male presenting with weakness and fatigue. Clinical history of bladder cancer.  EXAM: CHEST  2 VIEW  COMPARISON:  Prior chest x-ray 09/20/2014  FINDINGS: Low inspiratory volumes with nonspecific right basilar opacity. Increased AP diameter the chest with prominent retrosternal clear space and diffuse interstitial prominence and bronchial cuffing suggests underlying COPD. Stable cardiomegaly. Patient is status post median sternotomy with evidence of prior multivessel CABG. Atherosclerotic calcifications noted throughout the transverse aorta. No pleural effusion,  pneumothorax or evidence of pulmonary edema. No acute osseous abnormality.  IMPRESSION: 1. Nonspecific right lower lobe patchy airspace opacity. In the appropriate clinical setting, this could represent bronchopneumonia. Otherwise, subsegmental atelectasis superimposed on underlying chronic lung changes is a possibility. 2. Stable cardiomegaly and chronic parenchymal changes suggestive of underlying COPD.   Electronically Signed   By: Jacqulynn Cadet M.D.   On: 04/02/2015 10:08    EKG: reviewed Atrial fib with RVR at 110/min  Assessment/Plan Active Problems:   Weakness    Generalized weakness: Possibly from lidocaine patch and klonipin. Stopped them  . PT eval.    Stage 4 CKD: At baseline.    Hypertension: Accelerated, prn hydralazine.   Abnormal UA:  Currently denies any symptoms.   Abnormal CXR: Though CXR shows some right lower lobe opacity, he denies fever chills or cough and sob.  Clinically doesn't appear to have pneumonia.  Watch him for 24 hours and evaluate clinically to see if he need antibiotics.   Newly diagnosed atrial fibrillation: Rate is better controlled.   started On metoprolol    Code Status: presumed full code.  DVT Prophylaxis: Family Communication: daughter at bedside.  Disposition Plan: admitted to telemetry.   Time spent: 55 min  [facility] Pager [phone]

## 2015-04-02 NOTE — ED Notes (Signed)
PER EMS- pt picked up from home c/o increased weakness "just not feeling right", sluggish speech reported by family, and abnormal gait x2 days.  Arrived to ED alert and oriented per baseline.

## 2015-04-03 ENCOUNTER — Observation Stay (HOSPITAL_COMMUNITY): Payer: Medicare Other

## 2015-04-03 ENCOUNTER — Other Ambulatory Visit (HOSPITAL_COMMUNITY): Payer: Self-pay | Admitting: *Deleted

## 2015-04-03 DIAGNOSIS — J189 Pneumonia, unspecified organism: Secondary | ICD-10-CM | POA: Diagnosis not present

## 2015-04-03 DIAGNOSIS — R7989 Other specified abnormal findings of blood chemistry: Secondary | ICD-10-CM

## 2015-04-03 DIAGNOSIS — G4701 Insomnia due to medical condition: Secondary | ICD-10-CM | POA: Diagnosis not present

## 2015-04-03 DIAGNOSIS — I4510 Unspecified right bundle-branch block: Secondary | ICD-10-CM | POA: Diagnosis present

## 2015-04-03 DIAGNOSIS — C679 Malignant neoplasm of bladder, unspecified: Secondary | ICD-10-CM | POA: Diagnosis present

## 2015-04-03 DIAGNOSIS — I481 Persistent atrial fibrillation: Secondary | ICD-10-CM | POA: Diagnosis not present

## 2015-04-03 DIAGNOSIS — E43 Unspecified severe protein-calorie malnutrition: Secondary | ICD-10-CM | POA: Diagnosis present

## 2015-04-03 DIAGNOSIS — N184 Chronic kidney disease, stage 4 (severe): Secondary | ICD-10-CM | POA: Diagnosis not present

## 2015-04-03 DIAGNOSIS — F329 Major depressive disorder, single episode, unspecified: Secondary | ICD-10-CM | POA: Diagnosis not present

## 2015-04-03 DIAGNOSIS — R531 Weakness: Secondary | ICD-10-CM | POA: Diagnosis not present

## 2015-04-03 DIAGNOSIS — H9190 Unspecified hearing loss, unspecified ear: Secondary | ICD-10-CM

## 2015-04-03 DIAGNOSIS — Z951 Presence of aortocoronary bypass graft: Secondary | ICD-10-CM

## 2015-04-03 DIAGNOSIS — Z888 Allergy status to other drugs, medicaments and biological substances status: Secondary | ICD-10-CM

## 2015-04-03 DIAGNOSIS — I10 Essential (primary) hypertension: Secondary | ICD-10-CM | POA: Diagnosis present

## 2015-04-03 LAB — TROPONIN I: TROPONIN I: 0.09 ng/mL — AB (ref <0.031)

## 2015-04-03 LAB — TSH: TSH: 1.095 u[IU]/mL (ref 0.350–4.500)

## 2015-04-03 MED ORDER — MIRTAZAPINE 7.5 MG PO TABS
7.5000 mg | ORAL_TABLET | Freq: Every day | ORAL | Status: DC
  Administered 2015-04-03: 7.5 mg via ORAL
  Filled 2015-04-03 (×2): qty 1

## 2015-04-03 MED ORDER — BOOST PLUS PO LIQD
237.0000 mL | Freq: Three times a day (TID) | ORAL | Status: DC
  Administered 2015-04-03 – 2015-04-05 (×5): 237 mL via ORAL
  Filled 2015-04-03 (×8): qty 237

## 2015-04-03 MED ORDER — LEVOFLOXACIN IN D5W 500 MG/100ML IV SOLN
500.0000 mg | INTRAVENOUS | Status: DC
  Filled 2015-04-03: qty 100

## 2015-04-03 MED ORDER — LEVOFLOXACIN IN D5W 750 MG/150ML IV SOLN
750.0000 mg | Freq: Once | INTRAVENOUS | Status: AC
  Administered 2015-04-03: 750 mg via INTRAVENOUS
  Filled 2015-04-03: qty 150

## 2015-04-03 NOTE — Consult Note (Addendum)
Reason for Consult:   AF  Requesting Physician: [facility] Primary Cardiologist Dr Martinique  HPI: This is a 81 y.o. male with a past medical history significant for remote CABG and "wandering atrial pacemaker".  He underwent bladder surgery in December for bladder cancer and tolerated this well. No RT or chemo. He saw Dr Dr Martinique 03/16/15. He states he was feeling great then. He still enjoys doing his yard work. He was admitted 04/02/15 with 4 days of weakness. "I couldn't get out of bed", "no appetite". EKG now shows AF with CVR (his EKG from Dec 2015 also read as AF). His CHADs VASc = 7 for age, DM, vascular, and TIA hx. He looks frail, not sure he is a candidate for Coumadin. Since admission his Klonopin and Lidocaine patch have been discontinued but he says he still feel weak "not myself".    PMHx:  Past Medical History  Diagnosis Date  . CKD (chronic kidney disease), stage IV   . Hypertension   . Neuropathy   . Cancer 2002    prostate sp radiation  . Dehydration   . Weight loss   . Failure to thrive in childhood     In Adult  . Anemia   . History of TIAs   . Coronary artery disease     CABG May of 1991 six bypass  . Anginal pain     hx of  prior to CABG  . Stroke     hx of TIA's times 3  . BPH (benign prostatic hyperplasia)   . Lower extremity edema   . Cellulitis of right leg     hx of treated with clindamycin   . Neuromuscular disorder     peripheral neuropathy  . Dysrhythmia     hx of atrial arrhythymia per [facility] history 09/06/2014   . Chronic pain   . Urinary incontinence   . Hematuria     hx of per [facility] history 09/06/2014  . Depression     hx of per [facility] history 09/06/2014   . Secondary hyperparathyroidism     per [facility] history 09/06/2014   . Rectal incontinence     hx of per [facility] history 09/06/2014   . Hearing loss   . Arteriovenous fistula     left brachiocephalic     Past Surgical  History  Procedure Laterality Date  . Coronary artery bypass graft      1992  . Dg av dialysis  shunt access exist*l* or      left arm   . Total hip arthroplasty      right   . Transurethral resection of prostate      1989; [facility] 4-5/10 external beam radiotherapy completed 03/14/1989  . Tonsillectomy    . Colonscopy       polyps removed  . Eye surgery      cataract removed per left eye   . Hernia repair      inguinal bilateral   . Transurethral resection of bladder tumor with gyrus (turbt-gyrus) N/A 09/26/2014    Procedure: TRANSURETHRAL RESECTION OF BLADDER  WITH GYRUS (TURBT-GYRUS), bladder biopsy;  Surgeon: Jorja Loa, MD;  Location: [facility];  Service: Urology;  Laterality: N/A;    SOCHx:  reports that he has quit smoking. He has never used smokeless tobacco. He reports that he does not drink alcohol or use illicit drugs.  FAMHx: Family History  Problem Relation  Age of Onset  . Heart disease Mother   . Heart disease Brother     ALLERGIES: Allergies  Allergen Reactions  . Aspirin Anaphylaxis    Swelling of the face   . Ciprofloxacin Diarrhea  . Doxycycline Diarrhea and Nausea And Vomiting  . Amoxicillin Rash  . Cephalexin Rash    ROS: Pertinent items are noted in HPI. See H&P for complete ROS  HOME MEDICATIONS: Prior to Admission medications   Medication Sig Start Date End Date Taking? Authorizing Provider  clonazePAM (KLONOPIN) 0.5 MG tablet Take 0.25-0.5 mg by mouth 2 (two) times daily as needed for anxiety.   Yes Historical Provider, MD  clopidogrel (PLAVIX) 75 MG tablet Take 75 mg by mouth every morning.  12/15/14  Yes Historical Provider, MD  furosemide (LASIX) 80 MG tablet Take 120 mg by mouth daily.    Yes Historical Provider, MD  gabapentin (NEURONTIN) 100 MG capsule Take 100 mg by mouth 4 (four) times daily.    Yes Historical Provider, MD  lidocaine (LIDODERM) 5 % Place 1 patch onto the skin every 12 (twelve) hours as needed (for pain). Remove &  Discard patch within 12 hours or as directed by MD   Yes Historical Provider, MD  methadone (DOLOPHINE) 5 MG tablet Take 5 mg by mouth every 6 (six) hours.    Yes Historical Provider, MD  Multiple Vitamins-Minerals (MULTIVITAMINS THER. W/MINERALS) TABS Take 1 tablet by mouth every morning.    Yes Historical Provider, MD  NIFEdipine (PROCARDIA-XL/ADALAT-CC/NIFEDICAL-XL) 30 MG 24 hr tablet Take 30 mg by mouth every morning.  01/23/15  Yes Historical Provider, MD  simvastatin (ZOCOR) 80 MG tablet Take 40 mg by mouth every morning.   Yes Historical Provider, MD    HOSPITAL MEDICATIONS: I have reviewed the patient's current medications.  VITALS: Blood pressure 157/86, pulse 88, temperature 98.5 F (36.9 C), temperature source Oral, resp. rate 17, height 5\' 2"  (1.575 m), weight 116 lb 10 oz (52.9 kg), SpO2 97 %.  PHYSICAL EXAM: General appearance: alert, cooperative, cachectic, no distress and poor dentition, HOH Neck: no carotid bruit.  JVP 8 cm.  Lungs: crackles Rt base Heart: irregularly irregular rhythm and 2/6 systolic murmur LSB, AOV Abdomen: not tender, not distended Extremities: no edema Pulses: diminnished Skin: cool and dry Neurologic: Grossly normal  LABS: Results for orders placed or performed during the hospital encounter of 04/02/15 (from the past 24 hour(s))  Troponin I (q 6hr x 3)     Status: Abnormal   Collection Time: 04/02/15  4:22 PM  Result Value Ref Range   Troponin I 0.08 (H) <0.031 ng/mL  Troponin I (q 6hr x 3)     Status: Abnormal   Collection Time: 04/02/15 10:25 PM  Result Value Ref Range   Troponin I 0.08 (H) <0.031 ng/mL  Troponin I (q 6hr x 3)     Status: Abnormal   Collection Time: 04/03/15  4:30 AM  Result Value Ref Range   Troponin I 0.09 (H) <0.031 ng/mL    EKG: AF with RBBB  IMAGING: Dg Chest 2 View  04/02/2015   CLINICAL DATA:  81 year old male presenting with weakness and fatigue. Clinical history of bladder cancer.  EXAM: CHEST  2 VIEW   COMPARISON:  Prior chest x-ray 09/20/2014  FINDINGS: Low inspiratory volumes with nonspecific right basilar opacity. Increased AP diameter the chest with prominent retrosternal clear space and diffuse interstitial prominence and bronchial cuffing suggests underlying COPD. Stable cardiomegaly. Patient is status post median sternotomy with  evidence of prior multivessel CABG. Atherosclerotic calcifications noted throughout the transverse aorta. No pleural effusion, pneumothorax or evidence of pulmonary edema. No acute osseous abnormality.  IMPRESSION: 1. Nonspecific right lower lobe patchy airspace opacity. In the appropriate clinical setting, this could represent bronchopneumonia. Otherwise, subsegmental atelectasis superimposed on underlying chronic lung changes is a possibility. 2. Stable cardiomegaly and chronic parenchymal changes suggestive of underlying COPD.   Electronically Signed   By: Jacqulynn Cadet M.D.   On: 04/02/2015 10:08    IMPRESSION: Principal Problem:   Weakness-? Secondary to AF  Active Problems:   Atrial fibrillation- ? new   CKD (chronic kidney disease), stage IV   Protein-calorie malnutrition, severe   Hx of CABG '92   Essential hypertension   HOH (hard of hearing)   Bladder cancer-surgery Dec 2015   RBBB   RECOMMENDATION: Will review with MD, low dose beta blocker added and echo ordered by primary service. Previous echo in 2013 EF 55-60%. Check TSH.  He is on Plavix secondary to a allergy to ASA.   Time Spent Directly with Patient: 40 minutes  Erlene Quan [phone] beeper 04/03/2015, 12:28 PM   Patient seen with PA, agree with the above note.   1. Weakness: Main complaint is profound weakness.  He has not been able to get out of bed for the 4 days prior to admission.  He is in atrial fibrillation (which is new), but rate is controlled.  Not sure this can explain the profundity of his weakness.  Some of the problem may be sedating medications.  He has been on  methadone, then started clonazepam. Symptoms began after going on clonazepam.  - Check TSH - Will continue to observe him off clonazepam.  Will need PT consult.  - Echocardiogram.   - For now, continue rate control of atrial fibrillation.  - ?PNA on initial CXR but no fever.  WBCs mildly elevated.  ?Consider chest CT w/o contrast to evaluate more closely.  Will order procalcitonin.  2. Atrial fibrillation: Newly noted.  Has had wandering atrial pacemaker in the past.  CHADSVASC = 7, but currently he is very frail and unable to get out of bed.  Appears to be high fall risk.  I would like to see how he does over the next couple of days prior to deciding on anticoagulation.  If he is able to get out of bed and ambulate stably, would consider Eliquis 2.5 mg bid.  At baseline, he says he does well (can get out to do yardwork, etc).  HR is controlled on low dose metoprolol.  Needs echo to be done.  3. Elevated troponin: Very mild elevation, no trend.  Nonspecific, doubt ACS.  He has history of CABG.  Continue Plavix for now, if we start Eliquis in the future will need to stop Plavix.  4. CKD: Creatinine probably at baseline, CKD IV.   Loralie Champagne 04/03/2015 2:15 PM

## 2015-04-03 NOTE — Care Management Note (Signed)
Case Management Note  Patient Details  Name: Benjamin Macdonald MRN: [id number] Date of Birth: 1921/02/12  Subjective/Objective:      81 yo male admitted with weakness              Action/Plan:  Spoke with patient, spouse, and daughter (lives with parents). Plan is for patient to return home with [facility] services. Daughter [name] stated that the patient recently received [facility] services from [facility] and they would like to resume services. The patient has a walker, and wheelchair ( for longer distances), they are going to check on the other DME in the home and reevaliuate their needs for DME prior to DC. CM will continue to follow. Daughter [name] contact number [phone]. Expected Discharge Date:  04/04/15               Expected Discharge Plan:  [place]  In-House Referral:     Discharge planning Services  CM Consult  Post Acute Care Choice:    Choice offered to:  Adult Children, Patient, Spouse  DME Arranged:    DME Agency:     HH Arranged:    HH Agency:     Status of Service:  In process, will continue to follow  Medicare Important Message Given:    Date Medicare IM Given:    Medicare IM give by:    Date Additional Medicare IM Given:    Additional Medicare Important Message give by:     If discussed at [place] of Stay Meetings, dates discussed:    Additional Comments:  Scot Dock, RN 04/03/2015, 2:36 PM

## 2015-04-03 NOTE — Progress Notes (Signed)
Initial Nutrition Assessment  DOCUMENTATION CODES:  Severe malnutrition in context of chronic illness  INTERVENTION: - Will order Boost Plus TID, each supplement provides 360 kcal, 14 grams protein - RD will continue to monitor for needs  NUTRITION DIAGNOSIS:  Inadequate oral intake related to acute illness as evidenced by per patient/family report.  GOAL:  Patient will meet greater than or equal to 90% of their needs  MONITOR:  PO intake, Weight trends, Labs, I & O's  REASON FOR ASSESSMENT:  Malnutrition Screening Tool  ASSESSMENT: Per H&P, 81 y.o. male with h/o stage 4 CKD, hypertension, dehydration, CAD, s/p CABG, cva, NEWLY diagnosed atrial fibrillation reports generalized weakness for a few days. He was seen in the PCP office and was diagnosed with atrial fibrillation. But as his symptoms were persistent, he presented to ED.   Pt seen for MST. BMI indicates normal weight status. Pt reports that he ate breakfast this AM. Wife reports pt has eaten well since admission; chart review indicates 100% intake of breakfast yesterday. She states PTA pt's appetite had been poor for <1 month. She states that during that time, pt was losing weight. Per weight trend review, pt has lost 4 lbs (3% body weight) in 1.5 months which is not significant for time frame. He has lost 10 lbs (8% body weight) in 7 months which is not significant for time frame. Wife states pt drinks Boost at home and he is interested in receiving it here.  Likely beginning to meet needs. Severe muscle and fat wasting noted. Labs reviewed; BUN/creatinine elevated, GFR: 13.  Height:  Ht Readings from Last 1 Encounters:  04/02/15 5\' 2"  (1.575 m)    Weight:  Wt Readings from Last 1 Encounters:  04/02/15 116 lb 10 oz (52.9 kg)    Ideal Body Weight:  53.6 kg (kg)  Wt Readings from Last 10 Encounters:  04/02/15 116 lb 10 oz (52.9 kg)  03/16/15 116 lb 11.2 oz (52.935 kg)  02/15/15 120 lb (54.432 kg)  12/08/14  120 lb (54.432 kg)  09/26/14 126 lb (57.153 kg)  09/21/14 126 lb 8 oz (57.38 kg)  09/20/14 126 lb 8 oz (57.38 kg)  03/07/14 115 lb 1.9 oz (52.218 kg)  03/04/12 125 lb 9.6 oz (56.972 kg)  01/03/12 134 lb 9.6 oz (61.054 kg)    BMI:  Body mass index is 21.33 kg/(m^2).  Estimated Nutritional Needs:  Kcal:  1350-1550  Protein:  50-60 grams  Fluid:  2.5 L/day  Skin:  Reviewed, no issues  Diet Order:  Diet 2 gram sodium Room service appropriate?: Yes; Fluid consistency:: Thin  EDUCATION NEEDS:  No education needs identified at this time   Intake/Output Summary (Last 24 hours) at 04/03/15 1114 Last data filed at 04/03/15 0800  Gross per 24 hour  Intake    600 ml  Output   1000 ml  Net   -400 ml    Last BM:  6/12    Jarome Matin, RD, LDN Inpatient Clinical Dietitian Pager # [phone] After hours/weekend pager # [phone]

## 2015-04-03 NOTE — Progress Notes (Signed)
[facility] PROGRESS NOTE  Benjamin Macdonald HUD:[id number] DOB: 10-Dec-1920 DOA: 04/02/2015 PCP:  Melinda Crutch, MD  Assessment/Plan: 1. Generalized weakness: - unclear etiology. Even after stopping klonipin and lidocaine patch, his symptoms persist.  - will empirically start him on levaquin for presumed pneumonia.  - he is afebrile and mild leukocytosis.  - get CT chest without contrast for further evalution.  - PT EVAL for deconditioning.    Atrial fibrillation: - rate controlled with metoprolol.  Echocardiogram ordered.  - troponins slightly elevaed, but he denies any chest pain or sob.  - cardiology consulted and recommendations given.    Chronic pain syndrome: - resume methadone and tramadol.   STAGE 4 ckd: creatinine is better then baseline. Repeat in am.     Depression: Requested psychiatry for assistance with medications.    Code Status: full code.  Family Communication: wife at bedside.  Disposition Plan: pending. PT EVAL.   Consultants:  Cardiology  Physical therapy.   Procedures:  CT chest without contrast.  Antibiotics:  levaquin  HPI/Subjective: No sleep, feel tired.   Objective: Filed Vitals:   04/03/15 1415  BP: 130/70  Pulse: 100  Temp: 97.4 F (36.3 C)  Resp: 18    Intake/Output Summary (Last 24 hours) at 04/03/15 1727 Last data filed at 04/03/15 1416  Gross per 24 hour  Intake    720 ml  Output   1250 ml  Net   -530 ml   Filed Weights   04/02/15 1500  Weight: 52.9 kg (116 lb 10 oz)    Exam:   General:  Alert afebrile comfortable  Cardiovascular:s1s2 irregular  Respiratory: diminished at bases no wheezing or rhonchi  Abdomen: soft non tender non distended bowel sounds heard.   Musculoskeletal:  Bruising over the knee on the left knee.   Data Reviewed: Basic Metabolic Panel:  Recent Labs Lab 04/02/15 0950  NA 141  K 3.9  CL 105  CO2 26  GLUCOSE 109*  BUN 70*  CREATININE 3.65*  CALCIUM 9.1   Liver  Function Tests:  Recent Labs Lab 04/02/15 0950  AST 22  ALT 17  ALKPHOS 54  BILITOT 0.8  PROT 6.0*  ALBUMIN 3.2*   No results for input(s): LIPASE, AMYLASE in the last 168 hours. No results for input(s): AMMONIA in the last 168 hours. CBC:  Recent Labs Lab 04/02/15 0950  WBC 10.7*  NEUTROABS 8.7*  HGB 11.9*  HCT 38.8*  MCV 100.8*  PLT 281   Cardiac Enzymes:  Recent Labs Lab 04/02/15 0950 04/02/15 1622 04/02/15 2225 04/03/15 0430  TROPONINI 0.08* 0.08* 0.08* 0.09*   BNP (last 3 results)  Recent Labs  04/02/15 0950  BNP 400.8*    ProBNP (last 3 results) No results for input(s): PROBNP in the last 8760 hours.  CBG: No results for input(s): GLUCAP in the last 168 hours.  No results found for this or any previous visit (from the past 240 hour(s)).   Studies: Dg Chest 2 View  04/02/2015   CLINICAL DATA:  81 year old male presenting with weakness and fatigue. Clinical history of bladder cancer.  EXAM: CHEST  2 VIEW  COMPARISON:  Prior chest x-ray 09/20/2014  FINDINGS: Low inspiratory volumes with nonspecific right basilar opacity. Increased AP diameter the chest with prominent retrosternal clear space and diffuse interstitial prominence and bronchial cuffing suggests underlying COPD. Stable cardiomegaly. Patient is status post median sternotomy with evidence of prior multivessel CABG. Atherosclerotic calcifications noted throughout the transverse aorta. No pleural effusion, pneumothorax  or evidence of pulmonary edema. No acute osseous abnormality.  IMPRESSION: 1. Nonspecific right lower lobe patchy airspace opacity. In the appropriate clinical setting, this could represent bronchopneumonia. Otherwise, subsegmental atelectasis superimposed on underlying chronic lung changes is a possibility. 2. Stable cardiomegaly and chronic parenchymal changes suggestive of underlying COPD.   Electronically Signed   By: Jacqulynn Cadet M.D.   On: 04/02/2015 10:08    Scheduled  Meds: . clopidogrel  75 mg Oral q morning - 10a  . enoxaparin (LOVENOX) injection  30 mg Subcutaneous Q24H  . feeding supplement (ENSURE ENLIVE)  237 mL Oral BID BM  . gabapentin  100 mg Oral QID  . lactose free nutrition  237 mL Oral TID WC  . methadone  5 mg Oral Q6H  . metoprolol tartrate  12.5 mg Oral BID  . mirtazapine  7.5 mg Oral QHS   Continuous Infusions:   Principal Problem:   Insomnia due to medical condition Active Problems:   CKD (chronic kidney disease), stage IV   Atrial fibrillation   Weakness   CAP (community acquired pneumonia)   Protein-calorie malnutrition, severe   HOH (hard of hearing)   Hx of CABG '92   Bladder cancer-surgery Dec 2015   Essential hypertension   RBBB   Aspirin allergy    Time spent: 25 min    [facility] Pager [phone]. If 7PM-7AM, please contact night-coverage at [email or web address], password [facility] 04/03/2015, 5:27 PM  LOS: 1 day

## 2015-04-03 NOTE — Consult Note (Signed)
[facility] [facility] Consult   Reason for Consult:  Depression and insomnia Referring Physician:  Dr. Karleen Hampshire Patient Identification: Benjamin Macdonald MRN:  [id number] Principal Diagnosis: Insomnia due to medical condition Diagnosis:   Patient Active Problem List   Diagnosis Date Noted  . Protein-calorie malnutrition, severe [E43] 04/03/2015  . HOH (hard of hearing) [H91.90] 04/03/2015  . Hx of CABG '92 [Z95.1] 04/03/2015  . Bladder cancer-surgery Dec 2015 [C67.9] 04/03/2015  . Essential hypertension [I10] 04/03/2015  . RBBB [I45.10] 04/03/2015  . Aspirin allergy [Z88.9] 04/03/2015  . Insomnia due to medical condition [G47.01] 04/03/2015  . Weakness [R53.1] 04/02/2015  . CAP (community acquired pneumonia) [J18.9] 04/02/2015  . Fatigue [R53.83]   . Bladder tumor [D49.4]   . Hydronephrosis, bilateral [N13.30] 09/21/2014  . Coronary artery disease [I25.10]   . Atrial fibrillation [I48.91] 01/03/2012  . CKD (chronic kidney disease), stage IV [N18.4] 12/07/2011  . Dehydration [E86.0] 12/07/2011  . Weight loss [R63.4] 12/07/2011  . Failure to thrive in adult [R62.7] 12/07/2011  . ATHEROSCLEROSIS, RENAL ARTERY [I70.1] 01/19/2010  . FECAL INCONTINENCE [787.6] 09/06/2009  . PERSONAL HX COLONIC POLYPS [Z86.010] 09/06/2009    Total Time spent with patient: 45 minutes  Subjective:   Benjamin Macdonald is a 81 y.o. male patient admitted with depression, insomnia and weakness x 4 days.  HPI: Benjamin Macdonald is a 81 y.o. male seen face-to-face evaluation for psychiatric consultation and evaluation of depression and insomnia. Information for this evaluation up from the patient and his wife who was at bedside along with his daughter. Reportedly patient has been feeling tired, generalized weakness, decreased appetite and sleep. Reportedly patient has been active unable to work chores around the house until 4 or 5 days ago. patient stated he is sleeping 2-3 hours at nighttime and mostly  staring at ceiling and stated takes naps occasionally during the daytime. Patient has no previous history of mental health treatment. Patient has no symptoms of major depressive disorder, anxiety, psychosis. Patient has no suicidal or homicidal ideation, intention or plans. Patient has a hard hearing and a sponsor and speak loud enough for him. Patient has multiple medical problems as listed below.  HPI Elements:   Location:  Generalized weakness and insomnia. Quality:  Fair. Severity:  Decreased functioning possibly with the patient medication changes. Timing:  Unknown. Duration:  4 days. Context:  Multiple medical problems and recent medication changes.  Past Medical History:  Past Medical History  Diagnosis Date  . CKD (chronic kidney disease), stage IV   . Hypertension   . Neuropathy   . Cancer 2002    prostate sp radiation  . Dehydration   . Weight loss   . Failure to thrive in childhood     In Adult  . Anemia   . History of TIAs   . Coronary artery disease     CABG May of 1991 six bypass  . Anginal pain     hx of  prior to CABG  . Stroke     hx of TIA's times 3  . BPH (benign prostatic hyperplasia)   . Lower extremity edema   . Cellulitis of right leg     hx of treated with clindamycin   . Neuromuscular disorder     peripheral neuropathy  . Dysrhythmia     hx of atrial arrhythymia per [facility] history 09/06/2014   . Chronic pain   . Urinary incontinence   . Hematuria     hx of per  [facility] history 09/06/2014  . Depression     hx of per [facility] history 09/06/2014   . Secondary hyperparathyroidism     per [facility] history 09/06/2014   . Rectal incontinence     hx of per [facility] history 09/06/2014   . Hearing loss   . Arteriovenous fistula     left brachiocephalic     Past Surgical History  Procedure Laterality Date  . Coronary artery bypass graft      1992  . Dg av dialysis  shunt access exist*l* or      left arm   .  Total hip arthroplasty      right   . Transurethral resection of prostate      1989; [facility] 4-5/10 external beam radiotherapy completed 03/14/1989  . Tonsillectomy    . Colonscopy       polyps removed  . Eye surgery      cataract removed per left eye   . Hernia repair      inguinal bilateral   . Transurethral resection of bladder tumor with gyrus (turbt-gyrus) N/A 09/26/2014    Procedure: TRANSURETHRAL RESECTION OF BLADDER  WITH GYRUS (TURBT-GYRUS), bladder biopsy;  Surgeon: Chelsea Aus, MD;  Location: [facility];  Service: Urology;  Laterality: N/A;   Family History:  Family History  Problem Relation Age of Onset  . Heart disease Mother   . Heart disease Brother    Social History:  History  Alcohol Use No     History  Drug Use No    History   Social History  . Marital Status: Married    Spouse Name: N/A  . Number of Children: 3  . Years of Education: N/A   Occupational History  . printer    Social History Main Topics  . Smoking status: Former Games developer  . Smokeless tobacco: Never Used  . Alcohol Use: No  . Drug Use: No  . Sexual Activity: Not on file   Other Topics Concern  . None   Social History Narrative   Additional Social History: Patient lives with his wife and reportedly active around the house until recently.                          Allergies:   Allergies  Allergen Reactions  . Aspirin Anaphylaxis    Swelling of the face   . Ciprofloxacin Diarrhea  . Doxycycline Diarrhea and Nausea And Vomiting  . Amoxicillin Rash  . Cephalexin Rash    Labs:  Results for orders placed or performed during the hospital encounter of 04/02/15 (from the past 48 hour(s))  CBC with Differential/Platelet     Status: Abnormal   Collection Time: 04/02/15  9:50 AM  Result Value Ref Range   WBC 10.7 (H) 4.0 - 10.5 K/uL   RBC 3.85 (L) 4.22 - 5.81 MIL/uL   Hemoglobin 11.9 (L) 13.0 - 17.0 g/dL   HCT 64.1 (L) 60.0 - 02.9 %   MCV 100.8 (H) 78.0 - 100.0 fL    MCH 30.9 26.0 - 34.0 pg   MCHC 30.7 30.0 - 36.0 g/dL   RDW 70.4 91.1 - 43.5 %   Platelets 281 150 - 400 K/uL   Neutrophils Relative % 82 (H) 43 - 77 %   Neutro Abs 8.7 (H) 1.7 - 7.7 K/uL   Lymphocytes Relative 10 (L) 12 - 46 %   Lymphs Abs 1.0 0.7 - 4.0 K/uL  Monocytes Relative 8 3 - 12 %   Monocytes Absolute 0.9 0.1 - 1.0 K/uL   Eosinophils Relative 0 0 - 5 %   Eosinophils Absolute 0.0 0.0 - 0.7 K/uL   Basophils Relative 0 0 - 1 %   Basophils Absolute 0.0 0.0 - 0.1 K/uL  Comprehensive metabolic panel     Status: Abnormal   Collection Time: 04/02/15  9:50 AM  Result Value Ref Range   Sodium 141 135 - 145 mmol/L   Potassium 3.9 3.5 - 5.1 mmol/L   Chloride 105 101 - 111 mmol/L   CO2 26 22 - 32 mmol/L   Glucose, Bld 109 (H) 65 - 99 mg/dL   BUN 70 (H) 6 - 20 mg/dL   Creatinine, Ser 3.65 (H) 0.61 - 1.24 mg/dL   Calcium 9.1 8.9 - 10.3 mg/dL   Total Protein 6.0 (L) 6.5 - 8.1 g/dL   Albumin 3.2 (L) 3.5 - 5.0 g/dL   AST 22 15 - 41 U/L   ALT 17 17 - 63 U/L   Alkaline Phosphatase 54 38 - 126 U/L   Total Bilirubin 0.8 0.3 - 1.2 mg/dL   GFR calc non Af Amer 13 (L) >60 mL/min   GFR calc Af Amer 15 (L) >60 mL/min    Comment: (NOTE) The eGFR has been calculated using the CKD EPI equation. This calculation has not been validated in all clinical situations. eGFR's persistently <60 mL/min signify possible Chronic Kidney Disease.    Anion gap 10 5 - 15    Comment: Performed at [facility]  Troponin I     Status: Abnormal   Collection Time: 04/02/15  9:50 AM  Result Value Ref Range   Troponin I 0.08 (H) <0.031 ng/mL    Comment:        PERSISTENTLY INCREASED TROPONIN VALUES IN THE RANGE OF 0.04-0.49 ng/mL CAN BE SEEN IN:       -UNSTABLE ANGINA       -CONGESTIVE HEART FAILURE       -MYOCARDITIS       -CHEST TRAUMA       -ARRYHTHMIAS       -LATE PRESENTING MYOCARDIAL INFARCTION       -COPD   CLINICAL FOLLOW-UP RECOMMENDED. Performed at [facility]   Brain  natriuretic peptide     Status: Abnormal   Collection Time: 04/02/15  9:50 AM  Result Value Ref Range   B Natriuretic Peptide 400.8 (H) 0.0 - 100.0 pg/mL    Comment: Performed at [facility]  Urinalysis, Routine w reflex microscopic (not at [facility])     Status: Abnormal   Collection Time: 04/02/15 10:20 AM  Result Value Ref Range   Color, Urine YELLOW YELLOW   APPearance CLOUDY (A) CLEAR   Specific Gravity, Urine 1.015 1.005 - 1.030   pH 8.5 (H) 5.0 - 8.0   Glucose, UA NEGATIVE NEGATIVE mg/dL   Hgb urine dipstick SMALL (A) NEGATIVE   Bilirubin Urine NEGATIVE NEGATIVE   Ketones, ur NEGATIVE NEGATIVE mg/dL   Protein, ur 100 (A) NEGATIVE mg/dL   Urobilinogen, UA 0.2 0.0 - 1.0 mg/dL   Nitrite NEGATIVE NEGATIVE   Leukocytes, UA LARGE (A) NEGATIVE  Urine microscopic-add on     Status: Abnormal   Collection Time: 04/02/15 10:20 AM  Result Value Ref Range   Squamous Epithelial / LPF FEW (A) RARE   WBC, UA 3-6 <3 WBC/hpf   RBC / HPF 0-2 <3 RBC/hpf   Bacteria, UA MANY (  A) RARE   Casts HYALINE CASTS (A) NEGATIVE   Crystals TRIPLE PHOSPHATE CRYSTALS (A) NEGATIVE   Urine-Other MUCOUS PRESENT   Troponin I (q 6hr x 3)     Status: Abnormal   Collection Time: 04/02/15  4:22 PM  Result Value Ref Range   Troponin I 0.08 (H) <0.031 ng/mL    Comment:        PERSISTENTLY INCREASED TROPONIN VALUES IN THE RANGE OF 0.04-0.49 ng/mL CAN BE SEEN IN:       -UNSTABLE ANGINA       -CONGESTIVE HEART FAILURE       -MYOCARDITIS       -CHEST TRAUMA       -ARRYHTHMIAS       -LATE PRESENTING MYOCARDIAL INFARCTION       -COPD   CLINICAL FOLLOW-UP RECOMMENDED.   Troponin I (q 6hr x 3)     Status: Abnormal   Collection Time: 04/02/15 10:25 PM  Result Value Ref Range   Troponin I 0.08 (H) <0.031 ng/mL    Comment:        PERSISTENTLY INCREASED TROPONIN VALUES IN THE RANGE OF 0.04-0.49 ng/mL CAN BE SEEN IN:       -UNSTABLE ANGINA       -CONGESTIVE HEART FAILURE       -MYOCARDITIS       -CHEST  TRAUMA       -ARRYHTHMIAS       -LATE PRESENTING MYOCARDIAL INFARCTION       -COPD   CLINICAL FOLLOW-UP RECOMMENDED.   Troponin I (q 6hr x 3)     Status: Abnormal   Collection Time: 04/03/15  4:30 AM  Result Value Ref Range   Troponin I 0.09 (H) <0.031 ng/mL    Comment:        PERSISTENTLY INCREASED TROPONIN VALUES IN THE RANGE OF 0.04-0.49 ng/mL CAN BE SEEN IN:       -UNSTABLE ANGINA       -CONGESTIVE HEART FAILURE       -MYOCARDITIS       -CHEST TRAUMA       -ARRYHTHMIAS       -LATE PRESENTING MYOCARDIAL INFARCTION       -COPD   CLINICAL FOLLOW-UP RECOMMENDED.     Vitals: Blood pressure 157/86, pulse 88, temperature 98.5 F (36.9 C), temperature source Oral, resp. rate 17, height $RemoveBe'5\' 2"'jdTHEQPCb$  (1.575 m), weight 52.9 kg (116 lb 10 oz), SpO2 97 %.  Risk to Self: Is patient at risk for suicide?: No Risk to Others:   Prior Inpatient Therapy:   Prior Outpatient Therapy:    Current Facility-Administered Medications  Medication Dose Route Frequency Provider Last Rate Last Dose  . ALPRAZolam Duanne Moron) tablet 0.5 mg  0.5 mg Oral TID PRN Hosie Poisson, MD   0.5 mg at 04/02/15 2102  . clopidogrel (PLAVIX) tablet 75 mg  75 mg Oral q morning - 10a Hosie Poisson, MD   75 mg at 04/03/15 0917  . enoxaparin (LOVENOX) injection 30 mg  30 mg Subcutaneous Q24H Hosie Poisson, MD   30 mg at 04/02/15 1823  . feeding supplement (ENSURE ENLIVE) (ENSURE ENLIVE) liquid 237 mL  237 mL Oral BID BM Hosie Poisson, MD   237 mL at 04/03/15 0918  . gabapentin (NEURONTIN) capsule 100 mg  100 mg Oral QID Hosie Poisson, MD   100 mg at 04/03/15 0918  . hydrALAZINE (APRESOLINE) injection 2 mg  2 mg Intravenous Q6H PRN Hosie Poisson, MD      .  lactose free nutrition (BOOST PLUS) liquid 237 mL  237 mL Oral TID WC Maricela Bo Ostheim, RD   237 mL at 04/03/15 1200  . methadone (DOLOPHINE) tablet 5 mg  5 mg Oral Q6H Hosie Poisson, MD   5 mg at 04/03/15 0917  . metoprolol tartrate (LOPRESSOR) tablet 12.5 mg  12.5 mg Oral BID Hosie Poisson, MD   12.5 mg at 04/03/15 0917  . traMADol (ULTRAM) tablet 50 mg  50 mg Oral Q12H PRN Hosie Poisson, MD        Musculoskeletal: Strength & Muscle Tone: decreased Gait & Station: unable to stand Patient leans: N/A  Psychiatric Specialty Exam: Physical Exam as per history and physical   ROS generalized weakness, denies fever ,cough, sob, or chest pain. Review of Systems:  Constitutional: Generalized weakness.  HEENT: No headaches, Difficulty swallowing,Tooth/dental problems,Sore throat,  No sneezing, itching, ear ache, nasal congestion, post nasal drip,  Cardio-vascular: No chest pain, Orthopnea, PND, swelling in lower extremities, anasarca, dizziness, palpitations  GI: No heartburn, indigestion, abdominal pain, nausea, vomiting, diarrhea, change in bowel habits, loss of appetite  Resp: No shortness of breath with exertion or at rest. No excess mucus, no productive cough, No non-productive cough, No coughing up of blood.No change in color of mucus.No wheezing.No chest wall deformity  Skin: no rash or lesions.  GU: There no dysuria, change in color of urine, no urgency or frequency. No flank pain.  Musculoskeletal: No joint pain or swelling. No decreased range of motion. No back pain.    Blood pressure 157/86, pulse 88, temperature 98.5 F (36.9 C), temperature source Oral, resp. rate 17, height $RemoveBe'5\' 2"'mSrgjXziG$  (1.575 m), weight 52.9 kg (116 lb 10 oz), SpO2 97 %.Body mass index is 21.33 kg/(m^2).  General Appearance: Casual  Eye Contact::  Good  Speech:  Clear and Coherent  Volume:  Normal  Mood:  Euthymic  Affect:  Appropriate and Congruent  Thought Process:  Coherent and Goal Directed  Orientation:  Full (Time, Place, and Person)  Thought Content:  WDL  Suicidal Thoughts:  No  Homicidal Thoughts:  No  Memory:  Immediate;   Good Recent;   Good  Judgement:  Good  Insight:  Good  Psychomotor Activity:  Decreased  Concentration:  Fair  Recall:  [place] of Knowledge:Fair   Language: Good  Akathisia:  Negative  Handed:  Right  AIMS (if indicated):     Assets:  Communication Skills Desire for Improvement Financial Resources/Insurance Housing Intimacy Leisure Time Resilience Social Support Talents/Skills  ADL's:  Intact  Cognition: WNL  Sleep:      Medical Decision Making: Review of Psycho-Social Stressors (1), Review or order clinical lab tests (1), Established Problem, Worsening (2), Review of Last Therapy Session (1), Review or order medicine tests (1), Review of Medication Regimen & Side Effects (2) and Review of New Medication or Change in Dosage (2)  Treatment Plan Summary: Patient presented with recent onset of generalized weakness decreased appetite and insomnia. Patient also reportedly has peripheral neuropathy and sensation of pins and needles. Patient has been placed on Neurontin 100 mg 4 times a day. Daily contact with patient to assess and evaluate symptoms and progress in treatment and Medication management  Plan:  Start Remeron 7.5 mg PO Qhs for mood, insomnia and poor appetite May use melatonin 3-6 mg daily at bedtime for insomnia if Remeron does not work for him  Agrees with discontinuation of medication which made him increased generalized weakness  Patient does not  meet criteria for psychiatric inpatient admission. Supportive therapy provided about ongoing stressors. Appreciate psychiatric consultation Please contact [phone] or [phone] if needs further assistance  Disposition: Patient may return to the home and outpatient medication management and medically stable for discharge.   Zackarie Chason,JANARDHAHA R. 04/03/2015 2:25 PM

## 2015-04-03 NOTE — Progress Notes (Signed)
ANTIBIOTIC CONSULT NOTE - INITIAL  Pharmacy Consult for Levaquin Indication: pneumonia  Allergies  Allergen Reactions  . Aspirin Anaphylaxis    Swelling of the face   . Ciprofloxacin Diarrhea  . Doxycycline Diarrhea and Nausea And Vomiting  . Amoxicillin Rash  . Cephalexin Rash   Patient Measurements: Height: 5\' 2"  (157.5 cm) Weight: 116 lb 10 oz (52.9 kg) IBW/kg (Calculated) : 54.6  Vital Signs: Temp: 97.4 F (36.3 C) (06/13 1415) Temp Source: Oral (06/13 1415) BP: 130/70 mmHg (06/13 1415) Pulse Rate: 100 (06/13 1415) Intake/Output from previous day: 06/12 0701 - 06/13 0700 In: 600 [P.O.:600] Out: 750 [Urine:750] Intake/Output from this shift: Total I/O In: 360 [P.O.:360] Out: 500 [Urine:500]  Labs:  Recent Labs  04/02/15 0950  WBC 10.7*  HGB 11.9*  PLT 281  CREATININE 3.65*   Estimated Creatinine Clearance: 9.7 mL/min (by C-G formula based on Cr of 3.65). No results for input(s): VANCOTROUGH, VANCOPEAK, VANCORANDOM, GENTTROUGH, GENTPEAK, GENTRANDOM, TOBRATROUGH, TOBRAPEAK, TOBRARND, AMIKACINPEAK, AMIKACINTROU, AMIKACIN in the last 72 hours.   Microbiology: No results found for this or any previous visit (from the past 720 hour(s)).  Medical History: Past Medical History  Diagnosis Date  . CKD (chronic kidney disease), stage IV   . Hypertension   . Neuropathy   . Cancer 2002    prostate sp radiation  . Dehydration   . Weight loss   . Failure to thrive in childhood     In Adult  . Anemia   . History of TIAs   . Coronary artery disease     CABG May of 1991 six bypass  . Anginal pain     hx of  prior to CABG  . Stroke     hx of TIA's times 3  . BPH (benign prostatic hyperplasia)   . Lower extremity edema   . Cellulitis of right leg     hx of treated with clindamycin   . Neuromuscular disorder     peripheral neuropathy  . Dysrhythmia     hx of atrial arrhythymia per [facility] history 09/06/2014   . Chronic pain   . Urinary incontinence    . Hematuria     hx of per [facility] history 09/06/2014  . Depression     hx of per [facility] history 09/06/2014   . Secondary hyperparathyroidism     per [facility] history 09/06/2014   . Rectal incontinence     hx of per [facility] history 09/06/2014   . Hearing loss   . Arteriovenous fistula     left brachiocephalic    Medications:  Scheduled:  . clopidogrel  75 mg Oral q morning - 10a  . enoxaparin (LOVENOX) injection  30 mg Subcutaneous Q24H  . feeding supplement (ENSURE ENLIVE)  237 mL Oral BID BM  . gabapentin  100 mg Oral QID  . lactose free nutrition  237 mL Oral TID WC  . levofloxacin (LEVAQUIN) IV  750 mg Intravenous Once  . methadone  5 mg Oral Q6H  . metoprolol tartrate  12.5 mg Oral BID  . mirtazapine  7.5 mg Oral QHS   Anti-infectives    Start     Dose/Rate Route Frequency Ordered Stop   04/03/15 1830  levofloxacin (LEVAQUIN) IVPB 750 mg     750 mg 100 mL/hr over 90 Minutes Intravenous  Once 04/03/15 1748       Assessment: 89 yoM admitted with generalized weakness 6/12, new onset of Afib, possible adverse reaction  from Lidocaine patch, Clonazepam. Admit CXray possible pna, or atalectasis on chronic lung changes. Continued weakness, mild leukocytosis, begin empiric Levaquin.   HX of CKD 4, chronic pain syndrome - Methadone 5mg  q6hr.  Methadone and Levaquin can increase the risk of QTc prolongation  Goal of Therapy:  Antibiotic appropriate for treatment, renal function  Plan:   Levaquin 750mg  IV x1, then 500mg  IV q48h  Monitor telemetry, QTc interval  Change to po abx when appropriate  Minda Ditto PharmD Pager [phone] 04/03/2015, 6:51 PM

## 2015-04-04 ENCOUNTER — Observation Stay (HOSPITAL_BASED_OUTPATIENT_CLINIC_OR_DEPARTMENT_OTHER): Payer: Medicare Other

## 2015-04-04 DIAGNOSIS — I4891 Unspecified atrial fibrillation: Secondary | ICD-10-CM | POA: Diagnosis not present

## 2015-04-04 DIAGNOSIS — G4701 Insomnia due to medical condition: Secondary | ICD-10-CM | POA: Diagnosis not present

## 2015-04-04 DIAGNOSIS — I481 Persistent atrial fibrillation: Secondary | ICD-10-CM | POA: Diagnosis not present

## 2015-04-04 DIAGNOSIS — N184 Chronic kidney disease, stage 4 (severe): Secondary | ICD-10-CM | POA: Diagnosis not present

## 2015-04-04 DIAGNOSIS — F329 Major depressive disorder, single episode, unspecified: Secondary | ICD-10-CM

## 2015-04-04 DIAGNOSIS — R531 Weakness: Secondary | ICD-10-CM | POA: Diagnosis not present

## 2015-04-04 LAB — CBC
HCT: 40.7 % (ref 39.0–52.0)
Hemoglobin: 12.4 g/dL — ABNORMAL LOW (ref 13.0–17.0)
MCH: 31 pg (ref 26.0–34.0)
MCHC: 30.5 g/dL (ref 30.0–36.0)
MCV: 101.8 fL — ABNORMAL HIGH (ref 78.0–100.0)
Platelets: 313 10*3/uL (ref 150–400)
RBC: 4 MIL/uL — ABNORMAL LOW (ref 4.22–5.81)
RDW: 15.4 % (ref 11.5–15.5)
WBC: 12.5 10*3/uL — ABNORMAL HIGH (ref 4.0–10.5)

## 2015-04-04 LAB — BASIC METABOLIC PANEL
Anion gap: 12 (ref 5–15)
BUN: 87 mg/dL — ABNORMAL HIGH (ref 6–20)
CO2: 26 mmol/L (ref 22–32)
Calcium: 9.2 mg/dL (ref 8.9–10.3)
Chloride: 103 mmol/L (ref 101–111)
Creatinine, Ser: 3.56 mg/dL — ABNORMAL HIGH (ref 0.61–1.24)
GFR calc Af Amer: 16 mL/min — ABNORMAL LOW (ref >60)
GFR calc non Af Amer: 14 mL/min — ABNORMAL LOW (ref >60)
Glucose, Bld: 108 mg/dL — ABNORMAL HIGH (ref 65–99)
Potassium: 4.3 mmol/L (ref 3.5–5.1)
Sodium: 141 mmol/L (ref 135–145)

## 2015-04-04 MED ORDER — MIRTAZAPINE 15 MG PO TABS
15.0000 mg | ORAL_TABLET | Freq: Every day | ORAL | Status: DC
  Administered 2015-04-04: 15 mg via ORAL
  Filled 2015-04-04 (×2): qty 1

## 2015-04-04 MED ORDER — METOPROLOL SUCCINATE ER 25 MG PO TB24
25.0000 mg | ORAL_TABLET | Freq: Two times a day (BID) | ORAL | Status: DC
  Administered 2015-04-04 – 2015-04-05 (×2): 25 mg via ORAL
  Filled 2015-04-04 (×4): qty 1

## 2015-04-04 MED ORDER — METOPROLOL TARTRATE 25 MG PO TABS
25.0000 mg | ORAL_TABLET | Freq: Two times a day (BID) | ORAL | Status: DC
  Administered 2015-04-04: 25 mg via ORAL
  Filled 2015-04-04 (×2): qty 1

## 2015-04-04 MED ORDER — APIXABAN 2.5 MG PO TABS
2.5000 mg | ORAL_TABLET | Freq: Two times a day (BID) | ORAL | Status: DC
  Filled 2015-04-04 (×2): qty 1

## 2015-04-04 NOTE — Progress Notes (Signed)
*  PRELIMINARY RESULTS* Echocardiogram 2D Echocardiogram has been performed.  [name] 04/04/2015, 3:28 PM

## 2015-04-04 NOTE — Consult Note (Signed)
Psychiatry Consult follow up  Reason for Consult:  Depression and insomnia Referring Physician:  Dr. Karleen Hampshire Patient Identification: Benjamin Macdonald MRN:  [id number] Principal Diagnosis: Insomnia due to medical condition Diagnosis:   Patient Active Problem List   Diagnosis Date Noted  . Protein-calorie malnutrition, severe [E43] 04/03/2015  . HOH (hard of hearing) [H91.90] 04/03/2015  . Hx of CABG '92 [Z95.1] 04/03/2015  . Bladder cancer-surgery Dec 2015 [C67.9] 04/03/2015  . Essential hypertension [I10] 04/03/2015  . RBBB [I45.10] 04/03/2015  . Aspirin allergy [Z88.9] 04/03/2015  . Insomnia due to medical condition [G47.01] 04/03/2015  . Weakness [R53.1] 04/02/2015  . CAP (community acquired pneumonia) [J18.9] 04/02/2015  . Fatigue [R53.83]   . Bladder tumor [D49.4]   . Hydronephrosis, bilateral [N13.30] 09/21/2014  . Coronary artery disease [I25.10]   . Atrial fibrillation [I48.91] 01/03/2012  . CKD (chronic kidney disease), stage IV [N18.4] 12/07/2011  . Dehydration [E86.0] 12/07/2011  . Weight loss [R63.4] 12/07/2011  . Failure to thrive in adult [R62.7] 12/07/2011  . ATHEROSCLEROSIS, RENAL ARTERY [I70.1] 01/19/2010  . FECAL INCONTINENCE [787.6] 09/06/2009  . PERSONAL HX COLONIC POLYPS [Z86.010] 09/06/2009    Total Time spent with patient: 20 minutes  Subjective:   Benjamin Macdonald is a 81 y.o. male patient admitted with depression, insomnia and weakness x 4 days.  HPI: Benjamin Macdonald is a 81 y.o. male seen face-to-face evaluation for psychiatric consultation and evaluation of depression and insomnia. Information for this evaluation up from the patient and his wife who was at bedside along with his daughter. Reportedly patient has been feeling tired, generalized weakness, decreased appetite and sleep. Reportedly patient has been active unable to work chores around the house until 4 or 5 days ago. patient stated he is sleeping 2-3 hours at nighttime and mostly staring at  ceiling and stated takes naps occasionally during the daytime. Patient has no previous history of mental health treatment. Patient has no symptoms of major depressive disorder, anxiety, psychosis. Patient has no suicidal or homicidal ideation, intention or plans. Patient has a hard hearing and a sponsor and speak loud enough for him. Patient has multiple medical problems as listed below.  Interval History: Patient was seen for psychiatric consultation follow-up today. Patient appeared walking in hallway with a walker and help of staff and his wife. When asked about patient is sleep he said years and his wife stated he did not sleep well last night even after giving the medication. We will increase his medication Remeron to 15 mg tonight for better control of sleep and also recommended melatonin 3-6 mg at bedtime if needed which is over-the-counter medication does not required prescription.   Past Medical History:  Past Medical History  Diagnosis Date  . CKD (chronic kidney disease), stage IV   . Hypertension   . Neuropathy   . Cancer 2002    prostate sp radiation  . Dehydration   . Weight loss   . Failure to thrive in childhood     In Adult  . Anemia   . History of TIAs   . Coronary artery disease     CABG May of 1991 six bypass  . Anginal pain     hx of  prior to CABG  . Stroke     hx of TIA's times 3  . BPH (benign prostatic hyperplasia)   . Lower extremity edema   . Cellulitis of right leg     hx of treated with clindamycin   . Neuromuscular  disorder     peripheral neuropathy  . Dysrhythmia     hx of atrial arrhythymia per [facility] history 09/06/2014   . Chronic pain   . Urinary incontinence   . Hematuria     hx of per [facility] history 09/06/2014  . Depression     hx of per [facility] history 09/06/2014   . Secondary hyperparathyroidism     per [facility] history 09/06/2014   . Rectal incontinence     hx of per [facility] history 09/06/2014    . Hearing loss   . Arteriovenous fistula     left brachiocephalic     Past Surgical History  Procedure Laterality Date  . Coronary artery bypass graft      1992  . Dg av dialysis  shunt access exist*l* or      left arm   . Total hip arthroplasty      right   . Transurethral resection of prostate      1989; [facility] 4-5/10 external beam radiotherapy completed 03/14/1989  . Tonsillectomy    . Colonscopy       polyps removed  . Eye surgery      cataract removed per left eye   . Hernia repair      inguinal bilateral   . Transurethral resection of bladder tumor with gyrus (turbt-gyrus) N/A 09/26/2014    Procedure: TRANSURETHRAL RESECTION OF BLADDER  WITH GYRUS (TURBT-GYRUS), bladder biopsy;  Surgeon: Jorja Loa, MD;  Location: [facility];  Service: Urology;  Laterality: N/A;   Family History:  Family History  Problem Relation Age of Onset  . Heart disease Mother   . Heart disease Brother    Social History:  History  Alcohol Use No     History  Drug Use No    History   Social History  . Marital Status: Married    Spouse Name: N/A  . Number of Children: 3  . Years of Education: N/A   Occupational History  . printer    Social History Main Topics  . Smoking status: Former Research scientist (life sciences)  . Smokeless tobacco: Never Used  . Alcohol Use: No  . Drug Use: No  . Sexual Activity: Not on file   Other Topics Concern  . None   Social History Narrative   Additional Social History: Patient lives with his wife and reportedly active around the house until recently.                          Allergies:   Allergies  Allergen Reactions  . Aspirin Anaphylaxis    Swelling of the face   . Ciprofloxacin Diarrhea  . Doxycycline Diarrhea and Nausea And Vomiting  . Amoxicillin Rash  . Cephalexin Rash    Labs:  Results for orders placed or performed during the hospital encounter of 04/02/15 (from the past 48 hour(s))  Troponin I (q 6hr x 3)     Status: Abnormal    Collection Time: 04/02/15  4:22 PM  Result Value Ref Range   Troponin I 0.08 (H) <0.031 ng/mL    Comment:        PERSISTENTLY INCREASED TROPONIN VALUES IN THE RANGE OF 0.04-0.49 ng/mL CAN BE SEEN IN:       -UNSTABLE ANGINA       -CONGESTIVE HEART FAILURE       -MYOCARDITIS       -CHEST TRAUMA       -ARRYHTHMIAS       -  LATE PRESENTING MYOCARDIAL INFARCTION       -COPD   CLINICAL FOLLOW-UP RECOMMENDED.   Troponin I (q 6hr x 3)     Status: Abnormal   Collection Time: 04/02/15 10:25 PM  Result Value Ref Range   Troponin I 0.08 (H) <0.031 ng/mL    Comment:        PERSISTENTLY INCREASED TROPONIN VALUES IN THE RANGE OF 0.04-0.49 ng/mL CAN BE SEEN IN:       -UNSTABLE ANGINA       -CONGESTIVE HEART FAILURE       -MYOCARDITIS       -CHEST TRAUMA       -ARRYHTHMIAS       -LATE PRESENTING MYOCARDIAL INFARCTION       -COPD   CLINICAL FOLLOW-UP RECOMMENDED.   Troponin I (q 6hr x 3)     Status: Abnormal   Collection Time: 04/03/15  4:30 AM  Result Value Ref Range   Troponin I 0.09 (H) <0.031 ng/mL    Comment:        PERSISTENTLY INCREASED TROPONIN VALUES IN THE RANGE OF 0.04-0.49 ng/mL CAN BE SEEN IN:       -UNSTABLE ANGINA       -CONGESTIVE HEART FAILURE       -MYOCARDITIS       -CHEST TRAUMA       -ARRYHTHMIAS       -LATE PRESENTING MYOCARDIAL INFARCTION       -COPD   CLINICAL FOLLOW-UP RECOMMENDED.   TSH     Status: None   Collection Time: 04/03/15  1:27 PM  Result Value Ref Range   TSH 1.095 0.350 - 4.500 uIU/mL  CBC     Status: Abnormal   Collection Time: 04/04/15  5:13 AM  Result Value Ref Range   WBC 12.5 (H) 4.0 - 10.5 K/uL   RBC 4.00 (L) 4.22 - 5.81 MIL/uL   Hemoglobin 12.4 (L) 13.0 - 17.0 g/dL   HCT 40.7 39.0 - 52.0 %   MCV 101.8 (H) 78.0 - 100.0 fL   MCH 31.0 26.0 - 34.0 pg   MCHC 30.5 30.0 - 36.0 g/dL   RDW 15.4 11.5 - 15.5 %   Platelets 313 150 - 400 K/uL  Basic metabolic panel     Status: Abnormal   Collection Time: 04/04/15  5:13 AM  Result Value  Ref Range   Sodium 141 135 - 145 mmol/L   Potassium 4.3 3.5 - 5.1 mmol/L   Chloride 103 101 - 111 mmol/L   CO2 26 22 - 32 mmol/L   Glucose, Bld 108 (H) 65 - 99 mg/dL   BUN 87 (H) 6 - 20 mg/dL   Creatinine, Ser 3.56 (H) 0.61 - 1.24 mg/dL   Calcium 9.2 8.9 - 10.3 mg/dL   GFR calc non Af Amer 14 (L) >60 mL/min   GFR calc Af Amer 16 (L) >60 mL/min    Comment: (NOTE) The eGFR has been calculated using the CKD EPI equation. This calculation has not been validated in all clinical situations. eGFR's persistently <60 mL/min signify possible Chronic Kidney Disease.    Anion gap 12 5 - 15    Vitals: Blood pressure 173/82, pulse 91, temperature 98.4 F (36.9 C), temperature source Oral, resp. rate 18, height _0  (1.575 m), weight 52.9 kg (116 lb 10 oz), SpO2 98 %.  Risk to Self: Is patient at risk for suicide?: No Risk to Others:   Prior Inpatient Therapy:   Prior Outpatient Therapy:  Current Facility-Administered Medications  Medication Dose Route Frequency Provider Last Rate Last Dose  . ALPRAZolam Duanne Moron) tablet 0.5 mg  0.5 mg Oral TID PRN Hosie Poisson, MD   0.5 mg at 04/03/15 2133  . clopidogrel (PLAVIX) tablet 75 mg  75 mg Oral q morning - 10a Hosie Poisson, MD   75 mg at 04/04/15 1000  . enoxaparin (LOVENOX) injection 30 mg  30 mg Subcutaneous Q24H Hosie Poisson, MD   30 mg at 04/03/15 1733  . feeding supplement (ENSURE ENLIVE) (ENSURE ENLIVE) liquid 237 mL  237 mL Oral BID BM Hosie Poisson, MD   237 mL at 04/04/15 1001  . gabapentin (NEURONTIN) capsule 100 mg  100 mg Oral QID Hosie Poisson, MD   100 mg at 04/04/15 1000  . hydrALAZINE (APRESOLINE) injection 2 mg  2 mg Intravenous Q6H PRN Hosie Poisson, MD   2 mg at 04/04/15 0546  . lactose free nutrition (BOOST PLUS) liquid 237 mL  237 mL Oral TID WC Maricela Bo Ostheim, RD   237 mL at 04/04/15 0808  . [START ON 04/05/2015] levofloxacin (LEVAQUIN) IVPB 500 mg  500 mg Intravenous Q48H Minda Ditto, RPH      . methadone (DOLOPHINE) tablet  5 mg  5 mg Oral Q6H Hosie Poisson, MD   5 mg at 04/04/15 1000  . metoprolol tartrate (LOPRESSOR) tablet 25 mg  25 mg Oral BID Brett Canales, PA-C   25 mg at 04/04/15 1000  . mirtazapine (REMERON) tablet 7.5 mg  7.5 mg Oral QHS Ambrose Finland, MD   7.5 mg at 04/03/15 2127  . traMADol (ULTRAM) tablet 50 mg  50 mg Oral Q12H PRN Hosie Poisson, MD        Musculoskeletal: Strength & Muscle Tone: decreased Gait & Station: unable to stand Patient leans: N/A  Psychiatric Specialty Exam: Physical Exam   ROS   Blood pressure 173/82, pulse 91, temperature 98.4 F (36.9 C), temperature source Oral, resp. rate 18, height _0  (1.575 m), weight 52.9 kg (116 lb 10 oz), SpO2 98 %.Body mass index is 21.33 kg/(m^2).  General Appearance: Casual  Eye Contact::  Good  Speech:  Clear and Coherent  Volume:  Normal  Mood:  Euthymic  Affect:  Appropriate and Congruent  Thought Process:  Coherent and Goal Directed  Orientation:  Full (Time, Place, and Person)  Thought Content:  WDL  Suicidal Thoughts:  No  Homicidal Thoughts:  No  Memory:  Immediate;   Good Recent;   Good  Judgement:  Good  Insight:  Good  Psychomotor Activity:  Decreased  Concentration:  Fair  Recall:  [place] of Knowledge:Fair  Language: Good  Akathisia:  Negative  Handed:  Right  AIMS (if indicated):     Assets:  Communication Skills Desire for Improvement Financial Resources/Insurance Housing Intimacy Leisure Time Resilience Social Support Talents/Skills  ADL's:  Intact  Cognition: WNL  Sleep:      Medical Decision Making: Review of Psycho-Social Stressors (1), Review or order clinical lab tests (1), Established Problem, Worsening (2), Review of Last Therapy Session (1), Review or order medicine tests (1), Review of Medication Regimen & Side Effects (2) and Review of New Medication or Change in Dosage (2)  Treatment Plan Summary: Patient presented with recent onset of generalized weakness decreased appetite  and insomnia. Patient also reportedly has peripheral neuropathy and sensation of pins and needles. Patient has been placed on Neurontin 100 mg 4 times a day for neuropathy pain. Daily contact  with patient to assess and evaluate symptoms and progress in treatment and Medication management  Plan:  Increase Remeron 15 mg PO Qhs for mood, insomnia and poor appetite May use melatonin 3-6 mg daily at bedtime for insomnia if Remeron does not work for him  Continue Neurontin 100 mg 4 times daily for neuropathy pain.  Agrees with discontinuation of medication which made him increased generalized weakness  Patient does not meet criteria for psychiatric inpatient admission. Supportive therapy provided about ongoing stressors. Appreciate psychiatric consultation Please contact [phone] or [phone] if needs further assistance  Disposition: Patient may return to the home and outpatient medication management and medically stable for discharge.   Nimco Bivens,JANARDHAHA R. 04/04/2015 12:06 PM

## 2015-04-04 NOTE — Progress Notes (Signed)
    Subjective: Weak  Objective: Vital signs in last 24 hours: Temp:  [97.4 F (36.3 C)-98.4 F (36.9 C)] 98.4 F (36.9 C) (06/14 0525) Pulse Rate:  [91-100] 91 (06/14 0525) Resp:  [18-19] 18 (06/14 0525) BP: (130-187)/(70-116) 173/82 mmHg (06/14 0643) SpO2:  [97 %-99 %] 98 % (06/14 0525) Last BM Date: 04/03/15  Intake/Output from previous day: 06/13 0701 - 06/14 0700 In: 480 [P.O.:480] Out: 700 [Urine:700] Intake/Output this shift:    Medications Scheduled Meds: . clopidogrel  75 mg Oral q morning - 10a  . enoxaparin (LOVENOX) injection  30 mg Subcutaneous Q24H  . feeding supplement (ENSURE ENLIVE)  237 mL Oral BID BM  . gabapentin  100 mg Oral QID  . lactose free nutrition  237 mL Oral TID WC  . [START ON 04/05/2015] levofloxacin (LEVAQUIN) IV  500 mg Intravenous Q48H  . methadone  5 mg Oral Q6H  . metoprolol tartrate  12.5 mg Oral BID  . mirtazapine  7.5 mg Oral QHS   Continuous Infusions:  PRN Meds:.ALPRAZolam, hydrALAZINE, traMADol  PE: General appearance: alert, cooperative and no distress Neck: no JVD Lungs: Decreased BS throughout.  no wheeze or rales Heart: irregularly irregular rhythm and 2/6 sys MM in the axillae Abdomen: +BS, Nontender, no distention Extremities: No LEE Pulses: 2+ and symmetric Skin: Very dry Neurologic: Grossly normal  Lab Results:   Recent Labs  04/02/15 0950 04/04/15 0513  WBC 10.7* 12.5*  HGB 11.9* 12.4*  HCT 38.8* 40.7  PLT 281 313   BMET  Recent Labs  04/02/15 0950 04/04/15 0513  NA 141 141  K 3.9 4.3  CL 105 103  CO2 26 26  GLUCOSE 109* 108*  BUN 70* 87*  CREATININE 3.65* 3.56*  CALCIUM 9.1 9.2   Cardiac Panel (last 3 results)  Recent Labs  04/02/15 1622 04/02/15 2225 04/03/15 0430  TROPONINI 0.08* 0.08* 0.09*     Assessment/Plan  Principal Problem:   Weakness Previous echo(12/2011) indicated normal mitral valve with mild MR.  MR seems worse than that on exam.  Could be contributing to  weakness. Echo pending.   CKD (chronic kidney disease), stage IV  SCr elevated but stable   Atrial fibrillation Rate well controlled.  Increasing metoprolol for better BP.  CHADSVASC 7.  Consider low dose eliquis if PT eval is favorable.     Elevated Troponin  Mildly elevated and flat.  No CP.  Not thought to be ACS.     Essential hypertension   Poorly controlled.  Will increase metoprolol to 25.    Protein-calorie malnutrition, severe   HOH (hard of hearing)   Hx of CABG '92   Bladder cancer-surgery Dec 2015   RBBB   Aspirin allergy    LOS: 2 days    HAGER, BRYAN PA-C 04/04/2015 7:49 AM  Patient seen with PA, agree with the above note.  Remains profoundly weak.  Able to walk with lots of assistance.  SNF recommended.  TSH normal, not anemic, not markedly volume overloaded on exam.  CT chest did not show pulmonary edema or PNA. He remains in atrial fibrillation with HR in 90s.  He does have mitral area murmur on exam.  - Await echo today.  - As he is likely going to SNF and will be closely monitored, will stop Plavix and start Eliquis 2.5 mg bid tomorrow.  - Will change metoprolol to Toprol XL for more even rate control.   Loralie Champagne 04/04/2015 5:46 PM

## 2015-04-04 NOTE — Progress Notes (Signed)
ANTICOAGULATION CONSULT NOTE - Initial Consult  Pharmacy Consult for Apixaban (per Cardiology) Indication: atrial fibrillation  Allergies  Allergen Reactions  . Aspirin Anaphylaxis    Swelling of the face   . Ciprofloxacin Diarrhea  . Doxycycline Diarrhea and Nausea And Vomiting  . Amoxicillin Rash  . Cephalexin Rash    Patient Measurements: Height: 5\' 2"  (157.5 cm) Weight: 116 lb 10 oz (52.9 kg) IBW/kg (Calculated) : 54.6  Vital Signs: Temp: 98.1 F (36.7 C) (06/14 1357) Temp Source: Oral (06/14 1357) BP: 134/83 mmHg (06/14 1357) Pulse Rate: 81 (06/14 1357)  Labs:  Recent Labs  04/02/15 0950 04/02/15 1622 04/02/15 2225 04/03/15 0430 04/04/15 0513  HGB 11.9*  --   --   --  12.4*  HCT 38.8*  --   --   --  40.7  PLT 281  --   --   --  313  CREATININE 3.65*  --   --   --  3.56*  TROPONINI 0.08* 0.08* 0.08* 0.09*  --     Estimated Creatinine Clearance: 9.9 mL/min (by C-G formula based on Cr of 3.56).   Medical History: Past Medical History  Diagnosis Date  . CKD (chronic kidney disease), stage IV   . Hypertension   . Neuropathy   . Cancer 2002    prostate sp radiation  . Dehydration   . Weight loss   . Failure to thrive in childhood     In Adult  . Anemia   . History of TIAs   . Coronary artery disease     CABG May of 1991 six bypass  . Anginal pain     hx of  prior to CABG  . Stroke     hx of TIA's times 3  . BPH (benign prostatic hyperplasia)   . Lower extremity edema   . Cellulitis of right leg     hx of treated with clindamycin   . Neuromuscular disorder     peripheral neuropathy  . Dysrhythmia     hx of atrial arrhythymia per [facility] history 09/06/2014   . Chronic pain   . Urinary incontinence   . Hematuria     hx of per [facility] history 09/06/2014  . Depression     hx of per [facility] history 09/06/2014   . Secondary hyperparathyroidism     per [facility] history 09/06/2014   . Rectal incontinence     hx of  per [facility] history 09/06/2014   . Hearing loss   . Arteriovenous fistula     left brachiocephalic     Assessment: 89 y/oM with PMH of CKD, HTN, CAD s/p CABG, anemia, TIA, newly diagnosed a-fib who presented to [facility] ED on 6/12 with generalized weakness. Cardiology recommending patient to be started on Apixaban 2.5mg  BID tomorrow for a-fib, CHADSVASC = 7.   Patient meets 3/3 criteria for dose reduction to 2.5mg  BID (age => 82, weight <= 60 kg, SCr => 1.5)  CBC: Hgb 12.4 (stable), Pltc WNL  No bleeding issues noted  Goal of Therapy:  Stroke prevention Monitor platelets by anticoagulation protocol: Yes   Plan:   D/C Lovenox after today's dose.   Start Apixaban 2.5mg  PO BID tomorrow at 1000 per Dr. Claris Gladden request.   Of note, patients with SCr > 2.5, CrCl < 25 ml/min were excluded from clinical trials. MD made aware.   MD notes patient appears to be a high fall risk and likely not a good candidate for  warfarin therapy.   Plavix stopped per Cardiology.   Monitor for s/s of bleeding.   Lindell Spar, PharmD, BCPS Pager: [phone] 04/04/2015 6:07 PM

## 2015-04-04 NOTE — Progress Notes (Signed)
[facility] PROGRESS NOTE  [name]:[id number] DOB: [birth year] DOA: 04/02/2015 PCP:  Melinda Crutch, MD Interim summary: Benjamin Macdonald is a 81 y.o. male with h/o stage 4 CKD, hypertension, dehydration, CAD, s/p CABG, cva, NEWLY diagnosed atrial fibrillation reports generalized weakness for a few days Assessment/Plan: 1. Generalized weakness: - unclear etiology.  CXR was abnormal and followed up with CT which did not show any consolidation. UA is abnormal, urine cultures are pending.  On levaquin.   - PT EVAL for deconditioning.    Atrial fibrillation: - rate controlled with metoprolol.  Echocardiogram ordered.  - troponins slightly elevaed, but he denies any chest pain or sob.  - cardiology consulted and recommendations given.    Chronic pain syndrome: - resume methadone and tramadol.   STAGE 4 ckd: creatinine is better then baseline.     Depression: Requested psychiatry for assistance with medications. Started on Rameron and melatonin.    Code Status: full code.  Family Communication: wife at bedside.  Disposition Plan: pending. PT EVAL.   Consultants:  Cardiology  Physical therapy.   Procedures:  CT chest without contrast.  Antibiotics:  levaquin  HPI/Subjective: Very sleepy today.. Reports hasn't slept all night.   Objective: Filed Vitals:   04/04/15 1357  BP: 134/83  Pulse: 81  Temp: 98.1 F (36.7 C)  Resp: 18    Intake/Output Summary (Last 24 hours) at 04/04/15 1856 Last data filed at 04/04/15 1700  Gross per 24 hour  Intake    440 ml  Output    200 ml  Net    240 ml   Filed Weights   04/02/15 1500  Weight: 52.9 kg (116 lb 10 oz)    Exam:   General:  Alert afebrile comfortable  Cardiovascular:s1s2 irregular  Respiratory: diminished at bases no wheezing or rhonchi  Abdomen: soft non tender non distended bowel sounds heard.   Musculoskeletal:  Bruising over the knee on the left knee.   Data Reviewed: Basic  Metabolic Panel:  Recent Labs Lab 04/02/15 0950 04/04/15 0513  NA 141 141  K 3.9 4.3  CL 105 103  CO2 26 26  GLUCOSE 109* 108*  BUN 70* 87*  CREATININE 3.65* 3.56*  CALCIUM 9.1 9.2   Liver Function Tests:  Recent Labs Lab 04/02/15 0950  AST 22  ALT 17  ALKPHOS 54  BILITOT 0.8  PROT 6.0*  ALBUMIN 3.2*   No results for input(s): LIPASE, AMYLASE in the last 168 hours. No results for input(s): AMMONIA in the last 168 hours. CBC:  Recent Labs Lab 04/02/15 0950 04/04/15 0513  WBC 10.7* 12.5*  NEUTROABS 8.7*  --   HGB 11.9* 12.4*  HCT 38.8* 40.7  MCV 100.8* 101.8*  PLT 281 313   Cardiac Enzymes:  Recent Labs Lab 04/02/15 0950 04/02/15 1622 04/02/15 2225 04/03/15 0430  TROPONINI 0.08* 0.08* 0.08* 0.09*   BNP (last 3 results)  Recent Labs  04/02/15 0950  BNP 400.8*    ProBNP (last 3 results) No results for input(s): PROBNP in the last 8760 hours.  CBG: No results for input(s): GLUCAP in the last 168 hours.  No results found for this or any previous visit (from the past 240 hour(s)).   Studies: Ct Chest Wo Contrast  04/03/2015   CLINICAL DATA:  Right lower lobe airspace opacity seen on chest x-ray 04/02/2015 and mildly elevated white blood cell count. Generalized weakness.  EXAM: CT CHEST WITHOUT CONTRAST  TECHNIQUE: Multidetector CT imaging of the chest was  performed following the standard protocol without IV contrast.  COMPARISON:  PA and lateral chest 04/02/2015 and 12/06/2011.  FINDINGS: Cardiomegaly. Calcific aortic and coronary atherosclerosis is noted. No pleural or pericardial effusion. No axillary, hilar or mediastinal lymphadenopathy. Mild dependent atelectasis is present in the lung bases. No consolidative process, nodule or mass is identified. Incidentally imaged upper abdomen demonstrates no focal abnormality. There is partial visualization of a right nephrostomy tube.  IMPRESSION: Negative for pneumonia. Mild dependent bibasilar atelectasis is  present.  Cardiomegaly.  Atherosclerotic vascular disease.   Electronically Signed   By: Inge Rise M.D.   On: 04/03/2015 20:33    Scheduled Meds: . [START ON 04/05/2015] apixaban  2.5 mg Oral BID  . feeding supplement (ENSURE ENLIVE)  237 mL Oral BID BM  . gabapentin  100 mg Oral QID  . lactose free nutrition  237 mL Oral TID WC  . [START ON 04/05/2015] levofloxacin (LEVAQUIN) IV  500 mg Intravenous Q48H  . methadone  5 mg Oral Q6H  . metoprolol succinate  25 mg Oral BID  . mirtazapine  15 mg Oral QHS   Continuous Infusions:   Principal Problem:   Insomnia due to medical condition Active Problems:   CKD (chronic kidney disease), stage IV   Atrial fibrillation   Weakness   CAP (community acquired pneumonia)   Protein-calorie malnutrition, severe   HOH (hard of hearing)   Hx of CABG '92   Bladder cancer-surgery Dec 2015   Essential hypertension   RBBB   Aspirin allergy    Time spent: 25 min    [facility] Pager [phone]. If 7PM-7AM, please contact night-coverage at [email or web address], password [facility] 04/04/2015, 6:56 PM  LOS: 2 days

## 2015-04-04 NOTE — Evaluation (Signed)
Physical Therapy Evaluation Patient Details Name: HUEY SCALIA MRN: [id number] DOB: [birth year] Today's Date: 04/04/2015   History of Present Illness  81 yo male admitted with insomnia, weakness. Hx of stage IV CKD, HTN, CABG, CAD, TIA, A fib, neuropathy, LE edema, chronic pain, urinary incontinence.   Clinical Impression  On eval, pt required Min assist for mobility-able to ambulate ~50 feet with RW. Pt is weak and deconditioned. Fatigues easily with activity. REcommend ST rehab at SNF if possible. If SNF is not an option, then HHPT-will need 24 hour supervision/assist.     Follow Up Recommendations SNF;Supervision/Assistance - 24 hour (family undecided at time of eval)    Equipment Recommendations  None recommended by PT    Recommendations for Other Services       Precautions / Restrictions Precautions Precautions: Fall Precaution Comments: bil drains Restrictions Weight Bearing Restrictions: No      Mobility  Bed Mobility Overal bed mobility: Needs Assistance Bed Mobility: Supine to Sit     Supine to sit: Min assist     General bed mobility comments: Assist for trunk and LEs. Increased time.   Transfers Overall transfer level: Needs assistance Equipment used: Rolling walker (2 wheeled) Transfers: Sit to/from Stand Sit to Stand: Min assist         General transfer comment: Assist to rise, stabilize, control descent. Vc safety, hand placement.  Ambulation/Gait Ambulation/Gait assistance: Min assist Ambulation Distance (Feet): 50 Feet Assistive device: Rolling walker (2 wheeled) Gait Pattern/deviations: Step-to pattern;Trunk flexed     General Gait Details: Assist to stabilize and maneuver with RW. FAtigues fairly easily.   Stairs            Wheelchair Mobility    Modified Rankin (Stroke Patients Only)       Balance Overall balance assessment: Needs assistance         Standing balance support: Bilateral upper extremity supported;During  functional activity Standing balance-Leahy Scale: Poor                               Pertinent Vitals/Pain Pain Assessment: No/denies pain    Home Living Family/patient expects to be discharged to:: Private residence Living Arrangements: Children;Spouse/significant other Available Help at Discharge: Family Type of Home: House       Home Layout: One level;Able to live on main level with bedroom/bathroom Home Equipment: Gilford Rile - 2 wheels;Cane - single point      Prior Function Level of Independence: Independent               Hand Dominance        Extremity/Trunk Assessment   Upper Extremity Assessment: Generalized weakness           Lower Extremity Assessment: Generalized weakness      Cervical / Trunk Assessment: Kyphotic  Communication   Communication: HOH  Cognition Arousal/Alertness: Awake/alert Behavior During Therapy: Flat affect Overall Cognitive Status: Within Functional Limits for tasks assessed                      General Comments      Exercises        Assessment/Plan    PT Assessment Patient needs continued PT services  PT Diagnosis Difficulty walking;Abnormality of gait;Generalized weakness   PT Problem List Decreased strength;Decreased activity tolerance;Decreased balance;Decreased mobility;Decreased knowledge of use of DME  PT Treatment Interventions DME instruction;Gait training;Functional mobility training;Therapeutic activities;Therapeutic exercise;Patient/family education;Balance training  PT Goals (Current goals can be found in the Care Plan section) Acute Rehab PT Goals Patient Stated Goal: none stated PT Goal Formulation: With patient/family Time For Goal Achievement: 04/18/15 Potential to Achieve Goals: Good    Frequency Min 3X/week   Barriers to discharge        Co-evaluation               End of Session   Activity Tolerance: Patient limited by fatigue Patient left: in chair;with  call bell/phone within reach;with family/visitor present      Functional Assessment Tool Used: clinical judgement Functional Limitation: Mobility: Walking and moving around Mobility: Walking and Moving Around Current Status (I1030): At least 20 percent but less than 40 percent impaired, limited or restricted Mobility: Walking and Moving Around Goal Status [phone]): At least 1 percent but less than 20 percent impaired, limited or restricted    Time: 8887-5797 PT Time Calculation (min) (ACUTE ONLY): 21 min   Charges:   PT Evaluation $Initial PT Evaluation Tier I: 1 Procedure     PT G Codes:   PT G-Codes **NOT FOR INPATIENT CLASS** Functional Assessment Tool Used: clinical judgement Functional Limitation: Mobility: Walking and moving around Mobility: Walking and Moving Around Current Status (K8206): At least 20 percent but less than 40 percent impaired, limited or restricted Mobility: Walking and Moving Around Goal Status [phone]): At least 1 percent but less than 20 percent impaired, limited or restricted    Weston Anna, MPT Pager: [phone]

## 2015-04-05 ENCOUNTER — Inpatient Hospital Stay (HOSPITAL_COMMUNITY): Admission: RE | Admit: 2015-04-05 | Payer: BLUE CROSS/BLUE SHIELD | Source: Ambulatory Visit

## 2015-04-05 DIAGNOSIS — I10 Essential (primary) hypertension: Secondary | ICD-10-CM | POA: Diagnosis not present

## 2015-04-05 DIAGNOSIS — I4891 Unspecified atrial fibrillation: Secondary | ICD-10-CM | POA: Diagnosis not present

## 2015-04-05 DIAGNOSIS — R531 Weakness: Secondary | ICD-10-CM | POA: Diagnosis not present

## 2015-04-05 DIAGNOSIS — N184 Chronic kidney disease, stage 4 (severe): Secondary | ICD-10-CM

## 2015-04-05 DIAGNOSIS — I129 Hypertensive chronic kidney disease with stage 1 through stage 4 chronic kidney disease, or unspecified chronic kidney disease: Secondary | ICD-10-CM | POA: Diagnosis not present

## 2015-04-05 DIAGNOSIS — G4701 Insomnia due to medical condition: Secondary | ICD-10-CM | POA: Diagnosis not present

## 2015-04-05 DIAGNOSIS — F329 Major depressive disorder, single episode, unspecified: Secondary | ICD-10-CM | POA: Diagnosis not present

## 2015-04-05 DIAGNOSIS — I481 Persistent atrial fibrillation: Secondary | ICD-10-CM | POA: Diagnosis not present

## 2015-04-05 MED ORDER — ENSURE ENLIVE PO LIQD: 237.0000 mL | Freq: Two times a day (BID) | ORAL | Status: AC

## 2015-04-05 MED ORDER — FOSFOMYCIN TROMETHAMINE 3 G PO PACK
3.0000 g | PACK | Freq: Once | ORAL | Status: AC
  Administered 2015-04-05: 3 g via ORAL
  Filled 2015-04-05: qty 3

## 2015-04-05 MED ORDER — ENOXAPARIN SODIUM 30 MG/0.3ML ~~LOC~~ SOLN
30.0000 mg | SUBCUTANEOUS | Status: DC
  Filled 2015-04-05: qty 0.3

## 2015-04-05 MED ORDER — CLOPIDOGREL BISULFATE 75 MG PO TABS
75.0000 mg | ORAL_TABLET | Freq: Every day | ORAL | Status: DC
  Administered 2015-04-05: 75 mg via ORAL
  Filled 2015-04-05: qty 1

## 2015-04-05 MED ORDER — BOOST PLUS PO LIQD: 237.0000 mL | Freq: Three times a day (TID) | ORAL | Status: AC

## 2015-04-05 MED ORDER — MIRTAZAPINE 15 MG PO TABS: 15.0000 mg | ORAL_TABLET | Freq: Every day | ORAL | Status: AC

## 2015-04-05 MED ORDER — FUROSEMIDE 80 MG PO TABS: 80.0000 mg | ORAL_TABLET | Freq: Every day | ORAL | Status: AC

## 2015-04-05 MED ORDER — METOPROLOL SUCCINATE ER 25 MG PO TB24: 25.0000 mg | ORAL_TABLET | Freq: Two times a day (BID) | ORAL | Status: AC

## 2015-04-05 NOTE — Discharge Summary (Addendum)
Physician Discharge Summary  STEFFON GLADU QQP:[id number] DOB: 17-Mar-1921 DOA: 04/02/2015  PCP:  Melinda Crutch, MD  Admit date: 04/02/2015 Discharge date: 04/05/2015  Time spent: Greater than 30 minutes  Recommendations for Outpatient Follow-up:  1. Dr. Mardene Sayer, PCP in 5 days with repeat labs (CBC & BMP) 2. Dr. Peter Martinique, Cardiology in 2 weeks 3. Home health PT, OT, RN, aide, clinical social worker and shower tub 4. Dr. Jamal Maes, Nephrology  Discharge Diagnoses:  Principal Problem:   Insomnia due to medical condition Active Problems:   CKD (chronic kidney disease), stage IV   Atrial fibrillation   Weakness   CAP (community acquired pneumonia)   Protein-calorie malnutrition, severe   HOH (hard of hearing)   Hx of CABG '92   Bladder cancer-surgery Dec 2015   Essential hypertension   RBBB   Aspirin allergy   Discharge Condition: Improved & Stable  Diet recommendation: Heart healthy diet.  Filed Weights   04/02/15 1500  Weight: 52.9 kg (116 lb 10 oz)    History of present illness:  81 year old male patient with history of HTN, CAD, CABG, newly diagnosed A. fib, stage IV CKD, lives with his wife and ambulates without assistance, daughter lives upstairs, was admitted to [facility] on 04/02/15 with 3-4 week history of generalized weakness  Hospital Course:   Generalized weakness/? Failure to thrive - Etiology is not entirely clear. Chest x-ray revealed right lower lobe opacity but CT chest did not show pneumonia. Urine microscopy revealed many bacteria but only 3-6 WBCs. Unfortunately urine culture was never sent & patient was empirically placed on levofloxacin 750 MG and has received 1 dose thus far. Patient denies any symptoms suggestive of infection PTA-denies cough, fever, chills, diarrhea, urinary frequency, dysuria or difficulty urinating. His failure to thrive may be multifactorial related to advanced age, unable to definitely rule out UTI, newly  diagnosed A. fib, chronic kidney disease and medications - As per admitting M.D., patient was recently started on lidocaine patch (had been on for last 1-2 weeks) and Klonopin (just started a couple days prior to admission and had taken 2 doses in all) which may be contributing and these medications were discontinued. Physical therapy has evaluated and recommend SNF but family support is good and they wish to take him home with home health services. Patient will be treated with a dose of fosfomycin 1 dose prior to DC. Patient's chronic kidney disease may also be contribution into his failure to thrive. He may not be a good candidate for long-term hemodialysis and there are no urgent dialysis needs. TSH: Normal. Psychiatry was consulted and agreed with discontinuing culprit medications and recommended increasing Remeron to 15 mg daily at bedtime for mood, insomnia and poor appetite. They recommended melatonin 3-6 milligrams daily at bedtime for insomnia if Remeron does not work and continue Neurontin.  Stage IV chronic kidney disease - Creatinine at baseline per d/w daughter. - Due to concern for decreased oral intake, Lasix dose reduced as below. This can be revisited during outpatient follow-up.  Hypertension - Mildly uncontrolled. Nifedipine has been discontinued. Toprol-XL started to address A. fib which will also help with hypertension  Abnormal UA/? UTI - Management as outlined above  Newly diagnosed atrial fibrillation - Cardiology was consulted - Metoprolol started and rate is reasonably controlled - 2-D echo results as below. - As per cardiology discussion with pharmacy, Eliquis not studied at his current GFR therefore given fertility and ongoing fall risk as well as  poor creatinine clearance, patient left on Plavix alone. - CHADSVASC 7.  Elevated troponin - Mildly elevated and flat trend. No chest pain. Not thought to be ACS.  Severe protein calorie malnutrition - Continue  nutritional supplements  Status post bladder cancer surgery    Consultations:  Cardiology  Psychiatry  Procedures:  2-D echo 04/04/15: Study Conclusions  - Left ventricle: The cavity size was normal. There was severe concentric hypertrophy. Systolic function was normal. The estimated ejection fraction was in the range of 60% to 65%. LV mid ventricle false tendon. Wall motion was normal; there were no regional wall motion abnormalities. The study is not technically sufficient to allow evaluation of LV diastolic function. - Aortic valve: Trileaflet, moderately calcified with restricted leaflet motion. There is moderate aortic stenosis. There was trivial regurgitation. Valve area (VTI): 1.45 cm^2. Valve area (Vmax): 1.44 cm^2. Valve area (Vmean): 1.36 cm^2. - Mitral valve: Sclerotic leaflets. There was trivial regurgitation. - Left atrium: Severely dilated at 50 ml/m2. - Right atrium: The atrium was mildly dilated. - Atrial septum: Mobile IAS, cannot exclude PFO. - Tricuspid valve: There was mild regurgitation. - Pulmonary arteries: PA peak pressure: 37 mm Hg (S). - Inferior vena cava: The vessel was dilated. The respirophasic diameter changes were blunted (< 50%), consistent with elevated central venous pressure.  Impressions:  - Compared to the prior echo in 2013, LVEF is increased to 60-65%, there is now severe LVH, moderate aortic stenosis (AVA around 1.3-1.4 cm2) and severe LAE, mild TR with elevated RVSP of 37 mmHg, dilated IVC.   Discharge Exam:  Complaints: Continued generalized weakness-may be a little better. Denies cough, chest pain, dyspnea, headache, earache, sore throat, nausea, vomiting, diarrhea, urinary frequency or dysuria. Denies fevers or chills. Appetite good.   Filed Vitals:   04/04/15 0643 04/04/15 1357 04/04/15 2315 04/05/15 0700  BP: 173/82 134/83 126/60 154/74  Pulse:  81 92 100  Temp:  98.1 F (36.7 C) 97.9 F  (36.6 C) 97.5 F (36.4 C)  TempSrc:  Oral Oral Oral  Resp:  18 18 18   Height:      Weight:      SpO2:  98% 100% 98%    General exam: pleasant elderly frail male patient lying comfortably supine in bed.  Respiratory system: Clear. No increased work of breathing. Cardiovascular system: S1 & S2 heard, RRR. No JVD, murmurs, gallops, clicks or pedal edema. telemetry: A. fib with controlled ventricular rate.  Gastrointestinal system: Abdomen is nondistended, soft and nontender. Normal bowel sounds heard. Central nervous system: Alert and oriented. No focal neurological deficits. Extremities: Symmetric 5 x 5 power.  Discharge Instructions      Discharge Instructions    (HEART FAILURE PATIENTS) Call MD:  Anytime you have any of the following symptoms: 1) 3 pound weight gain in 24 hours or 5 pounds in 1 week 2) shortness of breath, with or without a dry hacking cough 3) swelling in the hands, feet or stomach 4) if you have to sleep on extra pillows at night in order to breathe.    Complete by:  As directed      Call MD for:  difficulty breathing, headache or visual disturbances    Complete by:  As directed      Call MD for:  extreme fatigue    Complete by:  As directed      Call MD for:  persistant dizziness or light-headedness    Complete by:  As directed      Call  MD for:  severe uncontrolled pain    Complete by:  As directed      Call MD for:  temperature >100.4    Complete by:  As directed      Diet - low sodium heart healthy    Complete by:  As directed      Increase activity slowly    Complete by:  As directed             Medication List    STOP taking these medications        clonazePAM 0.5 MG tablet  Commonly known as:  KLONOPIN     lidocaine 5 %  Commonly known as:  LIDODERM     NIFEdipine 30 MG 24 hr tablet  Commonly known as:  PROCARDIA-XL/ADALAT-CC/NIFEDICAL-XL      TAKE these medications        clopidogrel 75 MG tablet  Commonly known as:  PLAVIX  Take  75 mg by mouth every morning.     feeding supplement (ENSURE ENLIVE) Liqd  Take 237 mLs by mouth 2 (two) times daily between meals.     lactose free nutrition Liqd  Take 237 mLs by mouth 3 (three) times daily with meals.     furosemide 80 MG tablet  Commonly known as:  LASIX  Take 1 tablet (80 mg total) by mouth daily.     gabapentin 100 MG capsule  Commonly known as:  NEURONTIN  Take 100 mg by mouth 4 (four) times daily.     methadone 5 MG tablet  Commonly known as:  DOLOPHINE  Take 5 mg by mouth every 6 (six) hours.     metoprolol succinate 25 MG 24 hr tablet  Commonly known as:  TOPROL-XL  Take 1 tablet (25 mg total) by mouth 2 (two) times daily.     mirtazapine 15 MG tablet  Commonly known as:  REMERON  Take 1 tablet (15 mg total) by mouth at bedtime.     multivitamins ther. w/minerals Tabs tablet  Take 1 tablet by mouth every morning.     simvastatin 80 MG tablet  Commonly known as:  ZOCOR  Take 40 mg by mouth every morning.       Follow-up Information    Follow up with  Melinda Crutch, MD. Schedule an appointment as soon as possible for a visit in 5 days.   Specialty:  Family Medicine   Why:  to be seen with repeat labs (CBC & BMP).   Contact information:   [place] [address] [phone]       Follow up with Peter Martinique, MD. Schedule an appointment as soon as possible for a visit in 2 weeks.   Specialty:  Cardiology   Contact information:   [address] [place] [address] [phone]       Schedule an appointment as soon as possible for a visit with Lucrezia Starch, MD.   Specialty:  Nephrology   Contact information:   [place] [address] [phone]        The results of significant diagnostics from this hospitalization (including imaging, microbiology, ancillary and laboratory) are listed below for reference.    Significant Diagnostic Studies: Dg Chest 2 View  04/02/2015   CLINICAL  DATA:  81 year old male presenting with weakness and fatigue. Clinical history of bladder cancer.  EXAM: CHEST  2 VIEW  COMPARISON:  Prior chest x-ray 09/20/2014  FINDINGS: Low inspiratory volumes with nonspecific right  basilar opacity. Increased AP diameter the chest with prominent retrosternal clear space and diffuse interstitial prominence and bronchial cuffing suggests underlying COPD. Stable cardiomegaly. Patient is status post median sternotomy with evidence of prior multivessel CABG. Atherosclerotic calcifications noted throughout the transverse aorta. No pleural effusion, pneumothorax or evidence of pulmonary edema. No acute osseous abnormality.  IMPRESSION: 1. Nonspecific right lower lobe patchy airspace opacity. In the appropriate clinical setting, this could represent bronchopneumonia. Otherwise, subsegmental atelectasis superimposed on underlying chronic lung changes is a possibility. 2. Stable cardiomegaly and chronic parenchymal changes suggestive of underlying COPD.   Electronically Signed   By: Jacqulynn Cadet M.D.   On: 04/02/2015 10:08   Ct Chest Wo Contrast  04/03/2015   CLINICAL DATA:  Right lower lobe airspace opacity seen on chest x-ray 04/02/2015 and mildly elevated white blood cell count. Generalized weakness.  EXAM: CT CHEST WITHOUT CONTRAST  TECHNIQUE: Multidetector CT imaging of the chest was performed following the standard protocol without IV contrast.  COMPARISON:  PA and lateral chest 04/02/2015 and 12/06/2011.  FINDINGS: Cardiomegaly. Calcific aortic and coronary atherosclerosis is noted. No pleural or pericardial effusion. No axillary, hilar or mediastinal lymphadenopathy. Mild dependent atelectasis is present in the lung bases. No consolidative process, nodule or mass is identified. Incidentally imaged upper abdomen demonstrates no focal abnormality. There is partial visualization of a right nephrostomy tube.  IMPRESSION: Negative for pneumonia. Mild dependent bibasilar  atelectasis is present.  Cardiomegaly.  Atherosclerotic vascular disease.   Electronically Signed   By: Inge Rise M.D.   On: 04/03/2015 20:33      Microbiology: No results found for this or any previous visit (from the past 240 hour(s)).   Labs: Basic Metabolic Panel:  Recent Labs Lab 04/02/15 0950 04/04/15 0513  NA 141 141  K 3.9 4.3  CL 105 103  CO2 26 26  GLUCOSE 109* 108*  BUN 70* 87*  CREATININE 3.65* 3.56*  CALCIUM 9.1 9.2   Liver Function Tests:  Recent Labs Lab 04/02/15 0950  AST 22  ALT 17  ALKPHOS 54  BILITOT 0.8  PROT 6.0*  ALBUMIN 3.2*   No results for input(s): LIPASE, AMYLASE in the last 168 hours. No results for input(s): AMMONIA in the last 168 hours. CBC:  Recent Labs Lab 04/02/15 0950 04/04/15 0513  WBC 10.7* 12.5*  NEUTROABS 8.7*  --   HGB 11.9* 12.4*  HCT 38.8* 40.7  MCV 100.8* 101.8*  PLT 281 313   Cardiac Enzymes:  Recent Labs Lab 04/02/15 0950 04/02/15 1622 04/02/15 2225 04/03/15 0430  TROPONINI 0.08* 0.08* 0.08* 0.09*   BNP: BNP (last 3 results)  Recent Labs  04/02/15 0950  BNP 400.8*    ProBNP (last 3 results) No results for input(s): PROBNP in the last 8760 hours.  CBG: No results for input(s): GLUCAP in the last 168 hours.     Discussed at length with patient's spouse at bedside. Discussed in detail with patient's daughter [name] via phone. She was concerned that patient is weak and may need rehabilitation/SNF. This option has been discussed with her by case management and since patient will have to pay out of pocket, at this time they wish to take patient home with maximum home health services.   Signed:  Vernell Leep, MD, FACP, FHM. [facility] Pager [phone]  If 7PM-7AM, please contact night-coverage [email or web address] Password [email or web address] 04/05/2015, 1:37 PM

## 2015-04-05 NOTE — Consult Note (Signed)
Psychiatry Consult follow up  Reason for Consult:  Depression and insomnia Referring Physician:  Dr. Karleen Hampshire Patient Identification: [name] MRN:  [id number] Principal Diagnosis: Insomnia due to medical condition Diagnosis:   Patient Active Problem List   Diagnosis Date Noted  . Protein-calorie malnutrition, severe [E43] 04/03/2015  . HOH (hard of hearing) [H91.90] 04/03/2015  . Hx of CABG '92 [Z95.1] 04/03/2015  . Bladder cancer-surgery Dec 2015 [C67.9] 04/03/2015  . Essential hypertension [I10] 04/03/2015  . RBBB [I45.10] 04/03/2015  . Aspirin allergy [Z88.9] 04/03/2015  . Insomnia due to medical condition [G47.01] 04/03/2015  . Weakness [R53.1] 04/02/2015  . CAP (community acquired pneumonia) [J18.9] 04/02/2015  . Fatigue [R53.83]   . Bladder tumor [D49.4]   . Hydronephrosis, bilateral [N13.30] 09/21/2014  . Coronary artery disease [I25.10]   . Atrial fibrillation [I48.91] 01/03/2012  . CKD (chronic kidney disease), stage IV [N18.4] 12/07/2011  . Dehydration [E86.0] 12/07/2011  . Weight loss [R63.4] 12/07/2011  . Failure to thrive in adult [R62.7] 12/07/2011  . ATHEROSCLEROSIS, RENAL ARTERY [I70.1] 01/19/2010  . FECAL INCONTINENCE [787.6] 09/06/2009  . PERSONAL HX COLONIC POLYPS [Z86.010] 09/06/2009    Total Time spent with patient: 20 minutes  Subjective:   Benjamin Macdonald is a 81 y.o. male patient admitted with depression, insomnia and weakness x 4 days.  HPI: EDWARDO Macdonald is a 81 y.o. male seen face-to-face evaluation for psychiatric consultation and evaluation of depression and insomnia. Information for this evaluation up from the patient and his wife who was at bedside along with his daughter. Reportedly patient has been feeling tired, generalized weakness, decreased appetite and sleep. Reportedly patient has been active unable to work chores around the house until 4 or 5 days ago. patient stated he is sleeping 2-3 hours at nighttime and mostly staring at  ceiling and stated takes naps occasionally during the daytime. Patient has no previous history of mental health treatment. Patient has no symptoms of major depressive disorder, anxiety, psychosis. Patient has no suicidal or homicidal ideation, intention or plans. Patient has a hard hearing and a sponsor and speak loud enough for him. Patient has multiple medical problems as listed below.  Interval History: Patient has no new complaints. Patient stated that he has been eating much better and his sleep has not changed much and he has been staring at ceiling at night time. He was seen calm and relaxed in his bed and closed his eyes but easily awaken with verbal stimuli. His wife at bedside expressed her satisfaction about his better appetite. She does not know how many hours he slept last night. We will continue Remeron to 15 mg tonight for better control of sleep and also recommended melatonin 3-6 mg at bedtime if needed which is over-the-counter medication does not required prescription. He has no safety concerns.    Past Medical History:  Past Medical History  Diagnosis Date  . CKD (chronic kidney disease), stage IV   . Hypertension   . Neuropathy   . Cancer 2002    prostate sp radiation  . Dehydration   . Weight loss   . Failure to thrive in childhood     In Adult  . Anemia   . History of TIAs   . Coronary artery disease     CABG May of 1991 six bypass  . Anginal pain     hx of  prior to CABG  . Stroke     hx of TIA's times 3  . BPH (benign  prostatic hyperplasia)   . Lower extremity edema   . Cellulitis of right leg     hx of treated with clindamycin   . Neuromuscular disorder     peripheral neuropathy  . Dysrhythmia     hx of atrial arrhythymia per [facility] history 09/06/2014   . Chronic pain   . Urinary incontinence   . Hematuria     hx of per [facility] history 09/06/2014  . Depression     hx of per [facility] history 09/06/2014   . Secondary  hyperparathyroidism     per [facility] history 09/06/2014   . Rectal incontinence     hx of per [facility] history 09/06/2014   . Hearing loss   . Arteriovenous fistula     left brachiocephalic     Past Surgical History  Procedure Laterality Date  . Coronary artery bypass graft      1992  . Dg av dialysis  shunt access exist*l* or      left arm   . Total hip arthroplasty      right   . Transurethral resection of prostate      1989; [facility] 4-5/10 external beam radiotherapy completed 03/14/1989  . Tonsillectomy    . Colonscopy       polyps removed  . Eye surgery      cataract removed per left eye   . Hernia repair      inguinal bilateral   . Transurethral resection of bladder tumor with gyrus (turbt-gyrus) N/A 09/26/2014    Procedure: TRANSURETHRAL RESECTION OF BLADDER  WITH GYRUS (TURBT-GYRUS), bladder biopsy;  Surgeon: Chelsea Aus, MD;  Location: [facility];  Service: Urology;  Laterality: N/A;   Family History:  Family History  Problem Relation Age of Onset  . Heart disease Mother   . Heart disease Brother    Social History:  History  Alcohol Use No     History  Drug Use No    History   Social History  . Marital Status: Married    Spouse Name: N/A  . Number of Children: 3  . Years of Education: N/A   Occupational History  . printer    Social History Main Topics  . Smoking status: Former Games developer  . Smokeless tobacco: Never Used  . Alcohol Use: No  . Drug Use: No  . Sexual Activity: Not on file   Other Topics Concern  . None   Social History Narrative   Additional Social History: Patient lives with his wife and reportedly active around the house until recently.      Allergies:   Allergies  Allergen Reactions  . Aspirin Anaphylaxis    Swelling of the face   . Ciprofloxacin Diarrhea  . Doxycycline Diarrhea and Nausea And Vomiting  . Amoxicillin Rash  . Cephalexin Rash    Labs:  Results for orders placed or performed during the  hospital encounter of 04/02/15 (from the past 48 hour(s))  CBC     Status: Abnormal   Collection Time: 04/04/15  5:13 AM  Result Value Ref Range   WBC 12.5 (H) 4.0 - 10.5 K/uL   RBC 4.00 (L) 4.22 - 5.81 MIL/uL   Hemoglobin 12.4 (L) 13.0 - 17.0 g/dL   HCT 40.9 81.1 - 91.4 %   MCV 101.8 (H) 78.0 - 100.0 fL   MCH 31.0 26.0 - 34.0 pg   MCHC 30.5 30.0 - 36.0 g/dL   RDW 78.2 95.6 - 21.3 %  Platelets 313 150 - 400 K/uL  Basic metabolic panel     Status: Abnormal   Collection Time: 04/04/15  5:13 AM  Result Value Ref Range   Sodium 141 135 - 145 mmol/L   Potassium 4.3 3.5 - 5.1 mmol/L   Chloride 103 101 - 111 mmol/L   CO2 26 22 - 32 mmol/L   Glucose, Bld 108 (H) 65 - 99 mg/dL   BUN 87 (H) 6 - 20 mg/dL   Creatinine, Ser 3.56 (H) 0.61 - 1.24 mg/dL   Calcium 9.2 8.9 - 10.3 mg/dL   GFR calc non Af Amer 14 (L) >60 mL/min   GFR calc Af Amer 16 (L) >60 mL/min    Comment: (NOTE) The eGFR has been calculated using the CKD EPI equation. This calculation has not been validated in all clinical situations. eGFR's persistently <60 mL/min signify possible Chronic Kidney Disease.    Anion gap 12 5 - 15    Vitals: Blood pressure 154/74, pulse 100, temperature 97.5 F (36.4 C), temperature source Oral, resp. rate 18, height $RemoveBe'5\' 2"'iSUrClxau$  (1.575 m), weight 52.9 kg (116 lb 10 oz), SpO2 98 %.  Risk to Self: Is patient at risk for suicide?: No Risk to Others:   Prior Inpatient Therapy:   Prior Outpatient Therapy:    Current Facility-Administered Medications  Medication Dose Route Frequency Provider Last Rate Last Dose  . ALPRAZolam Duanne Moron) tablet 0.5 mg  0.5 mg Oral TID PRN Hosie Poisson, MD   0.5 mg at 04/03/15 2133  . clopidogrel (PLAVIX) tablet 75 mg  75 mg Oral Daily Larey Dresser, MD   75 mg at 04/05/15 1040  . enoxaparin (LOVENOX) injection 30 mg  30 mg Subcutaneous Q24H Anh P Pham, RPH      . feeding supplement (ENSURE ENLIVE) (ENSURE ENLIVE) liquid 237 mL  237 mL Oral BID BM Hosie Poisson, MD    237 mL at 04/05/15 1040  . gabapentin (NEURONTIN) capsule 100 mg  100 mg Oral QID Hosie Poisson, MD   100 mg at 04/05/15 1040  . hydrALAZINE (APRESOLINE) injection 2 mg  2 mg Intravenous Q6H PRN Hosie Poisson, MD   2 mg at 04/04/15 0546  . lactose free nutrition (BOOST PLUS) liquid 237 mL  237 mL Oral TID WC Maricela Bo Ostheim, RD   237 mL at 04/05/15 0824  . levofloxacin (LEVAQUIN) IVPB 500 mg  500 mg Intravenous Q48H Minda Ditto, RPH      . methadone (DOLOPHINE) tablet 5 mg  5 mg Oral Q6H Hosie Poisson, MD   5 mg at 04/04/15 1000  . metoprolol succinate (TOPROL-XL) 24 hr tablet 25 mg  25 mg Oral BID Larey Dresser, MD   25 mg at 04/05/15 2671  . mirtazapine (REMERON) tablet 15 mg  15 mg Oral QHS Ambrose Finland, MD   15 mg at 04/04/15 2059  . traMADol (ULTRAM) tablet 50 mg  50 mg Oral Q12H PRN Hosie Poisson, MD   50 mg at 04/05/15 1256    Musculoskeletal: Strength & Muscle Tone: decreased Gait & Station: unable to stand Patient leans: N/A  Psychiatric Specialty Exam: Physical Exam   ROS   Blood pressure 154/74, pulse 100, temperature 97.5 F (36.4 C), temperature source Oral, resp. rate 18, height $RemoveBe'5\' 2"'CRhYerRnz$  (1.575 m), weight 52.9 kg (116 lb 10 oz), SpO2 98 %.Body mass index is 21.33 kg/(m^2).  General Appearance: Casual  Eye Contact::  Good  Speech:  Clear and Coherent  Volume:  Normal  Mood:  Euthymic  Affect:  Appropriate and Congruent  Thought Process:  Coherent and Goal Directed  Orientation:  Full (Time, Place, and Person)  Thought Content:  WDL  Suicidal Thoughts:  No  Homicidal Thoughts:  No  Memory:  Immediate;   Good Recent;   Good  Judgement:  Good  Insight:  Good  Psychomotor Activity:  Decreased  Concentration:  Fair  Recall:  [place] of Knowledge:Fair  Language: Good  Akathisia:  Negative  Handed:  Right  AIMS (if indicated):     Assets:  Communication Skills Desire for Improvement Financial Resources/Insurance Housing Intimacy Leisure  Time Resilience Social Support Talents/Skills  ADL's:  Intact  Cognition: WNL  Sleep:      Medical Decision Making: Review of Psycho-Social Stressors (1), Review or order clinical lab tests (1), Established Problem, Worsening (2), Review of Last Therapy Session (1), Review or order medicine tests (1), Review of Medication Regimen & Side Effects (2) and Review of New Medication or Change in Dosage (2)  Treatment Plan Summary: Patient presented with recent onset of generalized weakness decreased appetite and insomnia. Patient also reportedly has peripheral neuropathy and sensation of pins and needles. Patient has been placed on Neurontin 100 mg 4 times a day for neuropathy pain. Daily contact with patient to assess and evaluate symptoms and progress in treatment and Medication management  Plan:  Continue Remeron 15 mg PO Qhs for mood, insomnia and poor appetite Continue Melatonin 3-6 mg daily at bedtime for insomnia if Remeron does not work for him  Continue Neurontin 100 mg 4 times daily for neuropathy pain.  Agrees with discontinuation of medication which made him increased generalized weakness  Patient does not meet criteria for psychiatric inpatient admission. Supportive therapy provided about ongoing stressors. Appreciate psychiatric consultation Please contact [phone] or [phone] if needs further assistance  Disposition: Patient may return to the home and outpatient medication management and medically stable for discharge.   Ilah Boule,JANARDHAHA R. 04/05/2015 1:41 PM

## 2015-04-05 NOTE — Care Management Note (Signed)
Case Management Note  Patient Details  Name: Benjamin Macdonald MRN: [id number] Date of Birth: 1920/12/04  Subjective/Objective:       81 yo male admitted with weakness and insomnia             Action/Plan:  Spoke with patient, spouse and daughter. Plan continues to be that patient will return home with spouse and daughter. Has walker, cane, BSC. Requesting shower chair and [place] services with [facility]. Communicated possible referral, await orders.  Expected Discharge Date:  04/04/15               Expected Discharge Plan:  [place]  In-House Referral:     Discharge planning Services  CM Consult  Post Acute Care Choice:    Choice offered to:  Adult Children, Patient, Spouse  DME Arranged:    DME Agency:     HH Arranged:    HH Agency:     Status of Service:  In process, will continue to follow  Medicare Important Message Given:    Date Medicare IM Given:    Medicare IM give by:    Date Additional Medicare IM Given:    Additional Medicare Important Message give by:     If discussed at [place] of Stay Meetings, dates discussed:    Additional Comments:  Scot Dock, RN 04/05/2015, 10:20 AM

## 2015-04-05 NOTE — Care Management Note (Signed)
Case Management Note  Patient Details  Name: Benjamin Macdonald MRN: [id number] Date of Birth: 04/24/1921  Subjective/Objective:        81 yo male admitted with weakness and insomnia            Action/Plan:  Spoke with patient, spouse, and daughter [name]. They stated that they are unable to pay privately for SNF and are comfortable going home with [facility] services. [facility] chose [facility] and [place], [facility] rep was communicated referral. Corinna Lines, [facility] DME rep, and made aware of DME order, will be delivered to room prior to discharge. Also discussed and provided [name] with a private sitter list for additional help in the home while she is at work. Patient and family verbalized understanding and had no other questions or concerns. Expected Discharge Date:  04/04/15               Expected Discharge Plan:  [place]  In-House Referral:     Discharge planning Services  CM Consult  Post Acute Care Choice:  Durable Medical Equipment, Home Health Choice offered to:  Adult Children, Patient, Spouse  DME Arranged:  Shower stool DME Agency:  [place] Arranged:  PT, OT, Nurse's Aide [place] Agency:  [place]  Status of Service:  Completed, signed off  Medicare Important Message Given:    Date Medicare IM Given:    Medicare IM give by:    Date Additional Medicare IM Given:    Additional Medicare Important Message give by:     If discussed at [place] of Stay Meetings, dates discussed:    Additional Comments:  Scot Dock, RN 04/05/2015, 2:19 PM

## 2015-04-05 NOTE — Clinical Social Work Psych Assess (Signed)
Clinical Social Work Nature conservation officer  Clinical Social Worker:  Ludwig Clarks, Seth Ward Date/Time:  04/05/2015, 10:28 AM Referred By:  Physician Date Referred:  04/04/15 Reason for Referral:  Behavioral Health Issues, SNF Placement   Presenting Symptoms/Problems  Presenting Symptoms/Problems(in person's/family's own words):   Patient referred due to Psych MD involvement and reported depression/insomnia- noted Psych MD actively involved and adjusting meds for better sleep. Per wife, patient is resting better- she is planning to take him home at dc- declining SNF as they have family support and will have [facility] care as well. CSW recommends [facility] SW follow up as well for support, community resource linking and other needs as identified.    Abuse/Neglect/Trauma History  Abuse/Neglect/Trauma History:  Denies History Abuse/Neglect/Trauma History Comments (indicate dates):  denies   Psychiatric History  Psychiatric History:  Denies History Psychiatric Medication:  See MAR   Current Mental Health Hospitalizations/Previous Mental Health History:      Current Provider:    Place and Date:     Current Medications:  See MAR   Previous Inpatient Admission/Date/Reason:      Emotional Health/Current Symptoms  Suicide/Self Harm: None Reported Suicide Attempt in Past (date/description):     Other Harmful Behavior (ex. homicidal ideation) (describe):      Psychotic/Dissociative Symptoms  Psychotic/Dissociative Symptoms: None Reported Other Psychotic/Dissociative Symptoms:      Attention/Behavioral Symptoms  Attention/Behavioral Symptoms: Within Normal Limits Other Attention/Behavioral Symptoms:      Cognitive Impairment  Cognitive Impairment:  Within Normal Limits Other Cognitive Impairment:      Mood and Adjustment  Mood and Adjustment:  Depression   Stress, Anxiety, Trauma, Any Recent Loss/Stressor  Stress, Anxiety, Trauma, Any Recent Loss/Stressor: None  Reported Anxiety (frequency):     Phobia (specify):     Compulsive Behavior (specify):     Obsessive Behavior (specify):     Other Stress, Anxiety, Trauma, Any Recent Loss/Stressor:      Substance Abuse/Use  Substance Abuse/Use: None SBIRT Completed (please refer for detailed history): No Self-reported Substance Use (last use and frequency):     Urinary Drug Screen Completed: No Alcohol Level:      Environment/Housing/Living Arrangement  Environmental/Housing/Living Arrangement: With Family Member Who is in the Home:  Lives with wife  Emergency Contact:  Wife and daughter   Financial  Financial: Medicare   Patient's Strengths and Goals  Patient's Strengths and Goals (patient's own words):  Patient and wife are hopeful the medications will help to improve his sleep and overall mental health well-being.  Family support and their willingness to acknowledge his decline and need for assessment/treatment.   Clinical Social Worker's Interpretive Summary  Clinical Social Workers Interpretive Summary:  CSW found patient to be resting and wife at bedside. No significant concerns at this time- noted Psych MD is adjusting meds with plans for followup today. Anticipate dc to home with [facility] and family support at dc.   Disposition  Disposition: Recommend Psych CSW Continuing To Support While In [facility]

## 2015-04-05 NOTE — Progress Notes (Signed)
Abel Presto Stamant to be D/C'd Home per MD order.  Discussed prescriptions and follow up appointments with the patient. Prescriptions given to patient, medication list explained in detail. Pt verbalized understanding.    Medication List    STOP taking these medications        clonazePAM 0.5 MG tablet  Commonly known as:  KLONOPIN     lidocaine 5 %  Commonly known as:  LIDODERM     NIFEdipine 30 MG 24 hr tablet  Commonly known as:  PROCARDIA-XL/ADALAT-CC/NIFEDICAL-XL      TAKE these medications        clopidogrel 75 MG tablet  Commonly known as:  PLAVIX  Take 75 mg by mouth every morning.     feeding supplement (ENSURE ENLIVE) Liqd  Take 237 mLs by mouth 2 (two) times daily between meals.     lactose free nutrition Liqd  Take 237 mLs by mouth 3 (three) times daily with meals.     furosemide 80 MG tablet  Commonly known as:  LASIX  Take 1 tablet (80 mg total) by mouth daily.     gabapentin 100 MG capsule  Commonly known as:  NEURONTIN  Take 100 mg by mouth 4 (four) times daily.     methadone 5 MG tablet  Commonly known as:  DOLOPHINE  Take 5 mg by mouth every 6 (six) hours.     metoprolol succinate 25 MG 24 hr tablet  Commonly known as:  TOPROL-XL  Take 1 tablet (25 mg total) by mouth 2 (two) times daily.     mirtazapine 15 MG tablet  Commonly known as:  REMERON  Take 1 tablet (15 mg total) by mouth at bedtime.     multivitamins ther. w/minerals Tabs tablet  Take 1 tablet by mouth every morning.     simvastatin 80 MG tablet  Commonly known as:  ZOCOR  Take 40 mg by mouth every morning.        Filed Vitals:   04/05/15 1530  BP: 139/56  Pulse: 65  Temp: 98 F (36.7 C)  Resp: 18    Skin clean, dry and intact without evidence of skin break down, no evidence of skin tears noted. IV catheter discontinued intact. Site without signs and symptoms of complications. Dressing and pressure applied. Pt denies pain at this time. No complaints noted.  An After Visit  Summary was printed and given to the patient. Patient escorted via [place], and D/C home via private auto.  Lolita Rieger 04/05/2015 3:58 PM

## 2015-04-05 NOTE — Discharge Instructions (Signed)

## 2015-04-05 NOTE — Progress Notes (Signed)
    Subjective: Still weak  Objective: Vital signs in last 24 hours: Temp:  [97.5 F (36.4 C)-98.1 F (36.7 C)] 97.5 F (36.4 C) (06/15 0700) Pulse Rate:  [81-100] 100 (06/15 0700) Resp:  [18] 18 (06/15 0700) BP: (126-154)/(60-83) 154/74 mmHg (06/15 0700) SpO2:  [98 %-100 %] 98 % (06/15 0700) Last BM Date: 04/04/15  Intake/Output from previous day: 06/14 0701 - 06/15 0700 In: 320 [P.O.:320] Out: -  Intake/Output this shift:    Medications Scheduled Meds: . apixaban  2.5 mg Oral BID  . feeding supplement (ENSURE ENLIVE)  237 mL Oral BID BM  . gabapentin  100 mg Oral QID  . lactose free nutrition  237 mL Oral TID WC  . levofloxacin (LEVAQUIN) IV  500 mg Intravenous Q48H  . methadone  5 mg Oral Q6H  . metoprolol succinate  25 mg Oral BID  . mirtazapine  15 mg Oral QHS   Continuous Infusions:  PRN Meds:.ALPRAZolam, hydrALAZINE, traMADol  PE: General appearance: alert, cooperative and no distress Lungs: clear to auscultation bilaterally Heart: regular rate and rhythm and 2/6 sys mm Extremities: No LEE Pulses: 2+ and symmetric Skin: Very dry Neurologic: Grossly normal  Lab Results:   Recent Labs  04/02/15 0950 04/04/15 0513  WBC 10.7* 12.5*  HGB 11.9* 12.4*  HCT 38.8* 40.7  PLT 281 313   BMET  Recent Labs  04/02/15 0950 04/04/15 0513  NA 141 141  K 3.9 4.3  CL 105 103  CO2 26 26  GLUCOSE 109* 108*  BUN 70* 87*  CREATININE 3.65* 3.56*  CALCIUM 9.1 9.2    Assessment/Plan  Principal Problem:  Weakness Previous echo(12/2011) indicated normal mitral valve with mild MR. MR seems worse than that on exam. Could be contributing to weakness. Echo pending.  CKD (chronic kidney disease), stage IV SCr elevated but stable  Atrial fibrillation Rate well controlled. Increasing metoprolol for better BP. CHADSVASC 7. Consider low dose eliquis if PT eval is favorable.   Elevated Troponin Mildly elevated and flat. No CP. Not  thought to be ACS.   Essential hypertension  Poorly controlled. Will increase metoprolol to 25.   Protein-calorie malnutrition, severe  HOH (hard of hearing)  Hx of CABG '92  Bladder cancer-surgery Dec 2015  RBBB  Aspirin allergy   LOS: 3 days    HAGER, BRYAN PA-C 04/05/2015 8:04 AM  Patient seen with PA, agree with the above note.  Still awaiting echo report, will look into why this is no available yet (echo done yesterday).  Stable today but still very weak.  Discussed anticoagulation with pharmacy, Eliquis not studied at his current GFR.  Therefore, given frailty and ongoing fall risk as well as poor creatinine clearance, I think it is going to be best to just keep him on Plavix as he was.   Loralie Champagne 04/05/2015 8:22 AM

## 2015-04-12 ENCOUNTER — Other Ambulatory Visit: Payer: Self-pay | Admitting: Urology

## 2015-04-12 ENCOUNTER — Encounter (HOSPITAL_COMMUNITY)
Admission: RE | Admit: 2015-04-12 | Discharge: 2015-04-12 | Disposition: A | Discharge status: Home / Self Care | Payer: Medicare Other | Source: Ambulatory Visit | Attending: Nephrology | Admitting: Nephrology

## 2015-04-12 ENCOUNTER — Ambulatory Visit (HOSPITAL_COMMUNITY)
Admission: RE | Admit: 2015-04-12 | Discharge: 2015-04-12 | Disposition: A | Discharge status: Home / Self Care | Payer: Medicare Other | Source: Ambulatory Visit | Attending: Urology | Admitting: Urology

## 2015-04-12 DIAGNOSIS — N1330 Unspecified hydronephrosis: Secondary | ICD-10-CM | POA: Insufficient documentation

## 2015-04-12 DIAGNOSIS — Z5181 Encounter for therapeutic drug level monitoring: Secondary | ICD-10-CM | POA: Diagnosis not present

## 2015-04-12 DIAGNOSIS — Z79899 Other long term (current) drug therapy: Secondary | ICD-10-CM | POA: Insufficient documentation

## 2015-04-12 DIAGNOSIS — Z436 Encounter for attention to other artificial openings of urinary tract: Secondary | ICD-10-CM | POA: Diagnosis present

## 2015-04-12 DIAGNOSIS — D631 Anemia in chronic kidney disease: Secondary | ICD-10-CM | POA: Insufficient documentation

## 2015-04-12 DIAGNOSIS — N1339 Other hydronephrosis: Secondary | ICD-10-CM

## 2015-04-12 DIAGNOSIS — N185 Chronic kidney disease, stage 5: Secondary | ICD-10-CM | POA: Insufficient documentation

## 2015-04-12 LAB — IRON AND TIBC
IRON: 52 ug/dL (ref 45–182)
SATURATION RATIOS: 19 % (ref 17.9–39.5)
TIBC: 267 ug/dL (ref 250–450)
UIBC: 215 ug/dL

## 2015-04-12 LAB — FERRITIN: FERRITIN: 662 ng/mL — AB (ref 24–336)

## 2015-04-12 LAB — POCT HEMOGLOBIN-HEMACUE: Hemoglobin: 11.4 g/dL — ABNORMAL LOW (ref 13.0–17.0)

## 2015-04-12 MED ORDER — DARBEPOETIN ALFA 100 MCG/0.5ML IJ SOSY
100.0000 ug | PREFILLED_SYRINGE | INTRAMUSCULAR | Status: DC
  Administered 2015-04-12: 100 ug via SUBCUTANEOUS

## 2015-04-12 MED ORDER — LIDOCAINE HCL 1 % IJ SOLN
INTRAMUSCULAR | Status: AC
  Filled 2015-04-12: qty 20

## 2015-04-12 MED ORDER — DARBEPOETIN ALFA 100 MCG/0.5ML IJ SOSY
PREFILLED_SYRINGE | INTRAMUSCULAR | Status: AC
  Filled 2015-04-12: qty 0.5

## 2015-04-12 MED ORDER — IOHEXOL 300 MG/ML  SOLN
20.0000 mL | Freq: Once | INTRAMUSCULAR | Status: AC | PRN
  Administered 2015-04-12: 20 mL via INTRAVENOUS

## 2015-04-12 NOTE — Procedures (Signed)
Successful bilateral 10 Fr PCN exchange.  No immediate complications.

## 2015-04-18 ENCOUNTER — Other Ambulatory Visit (HOSPITAL_COMMUNITY): Payer: BLUE CROSS/BLUE SHIELD

## 2015-05-24 ENCOUNTER — Encounter (HOSPITAL_COMMUNITY): Payer: BLUE CROSS/BLUE SHIELD

## 2015-05-25 ENCOUNTER — Other Ambulatory Visit: Payer: Self-pay | Admitting: Urology

## 2015-05-25 ENCOUNTER — Ambulatory Visit (HOSPITAL_COMMUNITY)
Admission: RE | Admit: 2015-05-25 | Discharge: 2015-05-25 | Disposition: A | Discharge status: Home / Self Care | Source: Ambulatory Visit | Attending: Urology | Admitting: Urology

## 2015-05-25 DIAGNOSIS — Z8551 Personal history of malignant neoplasm of bladder: Secondary | ICD-10-CM | POA: Insufficient documentation

## 2015-05-25 DIAGNOSIS — N1339 Other hydronephrosis: Secondary | ICD-10-CM

## 2015-05-25 DIAGNOSIS — Z436 Encounter for attention to other artificial openings of urinary tract: Secondary | ICD-10-CM | POA: Insufficient documentation

## 2015-05-25 DIAGNOSIS — N131 Hydronephrosis with ureteral stricture, not elsewhere classified: Secondary | ICD-10-CM | POA: Insufficient documentation

## 2015-05-25 MED ORDER — IOHEXOL 300 MG/ML  SOLN
20.0000 mL | Freq: Once | INTRAMUSCULAR | Status: AC | PRN
  Administered 2015-05-25: 20 mL

## 2015-05-25 MED ORDER — LIDOCAINE HCL 1 % IJ SOLN
INTRAMUSCULAR | Status: AC
  Filled 2015-05-25: qty 20

## 2015-05-25 NOTE — Procedures (Signed)
Successful BILATERAL PCN EXCHGS NO COMP STABLE FULL REPORT IN PACS

## 2015-06-29 ENCOUNTER — Other Ambulatory Visit: Payer: Self-pay | Admitting: Urology

## 2015-06-29 ENCOUNTER — Other Ambulatory Visit (HOSPITAL_COMMUNITY): Payer: Self-pay | Admitting: Interventional Radiology

## 2015-06-29 ENCOUNTER — Ambulatory Visit (HOSPITAL_COMMUNITY)
Admission: RE | Admit: 2015-06-29 | Discharge: 2015-06-29 | Disposition: A | Discharge status: Home / Self Care | Source: Ambulatory Visit | Attending: Urology | Admitting: Urology

## 2015-06-29 DIAGNOSIS — N1339 Other hydronephrosis: Secondary | ICD-10-CM

## 2015-06-29 DIAGNOSIS — Z436 Encounter for attention to other artificial openings of urinary tract: Secondary | ICD-10-CM | POA: Diagnosis not present

## 2015-06-29 DIAGNOSIS — N1330 Unspecified hydronephrosis: Secondary | ICD-10-CM | POA: Diagnosis not present

## 2015-06-29 MED ORDER — IOHEXOL 300 MG/ML  SOLN
10.0000 mL | Freq: Once | INTRAMUSCULAR | Status: DC | PRN
  Administered 2015-06-29: 10 mL
  Filled 2015-06-29: qty 10

## 2015-06-29 MED ORDER — LIDOCAINE HCL 1 % IJ SOLN
INTRAMUSCULAR | Status: AC
  Filled 2015-06-29: qty 20

## 2015-06-29 NOTE — Procedures (Signed)
Successful bilateral PCN exchange.   No immediate complications.   Ronny Bacon, MD Pager #: [phone]

## 2015-07-06 ENCOUNTER — Other Ambulatory Visit (HOSPITAL_COMMUNITY): Payer: BLUE CROSS/BLUE SHIELD

## 2015-07-22 DEATH — deceased

## 2015-08-01 ENCOUNTER — Telehealth: Payer: Self-pay | Admitting: Cardiology

## 2015-08-01 NOTE — Telephone Encounter (Signed)
Benjamin Macdonald passed away on 07-24-2015

## 2015-08-10 ENCOUNTER — Other Ambulatory Visit (HOSPITAL_COMMUNITY): Payer: BLUE CROSS/BLUE SHIELD

## 2015-09-22 IMAGING — XA IR EXCHANGE NEPHROSTOMY RIGHT
1 series · 5 of 5 positions shown · non-contrast
Comparison: Multiple prior fluoroscopic guided nephrostomy
exchanges

INDICATION: Routine exchange

EXAM:
FLUOROSCOPIC GUIDED BILATERAL SIDED NEPHROSTOMY CATHETER EXCHANGE
TECHNIQUE: Informed written consent was obtained from the patient after a
discussion of the risks, benefits and alternatives to treatment.
Questions regarding the procedure were encouraged and answered. A
timeout was performed prior to the initiation of the procedure.

[Series 300: ir nephrostomy exchange right · 5 of 5 slices shown]
[im 1/5]
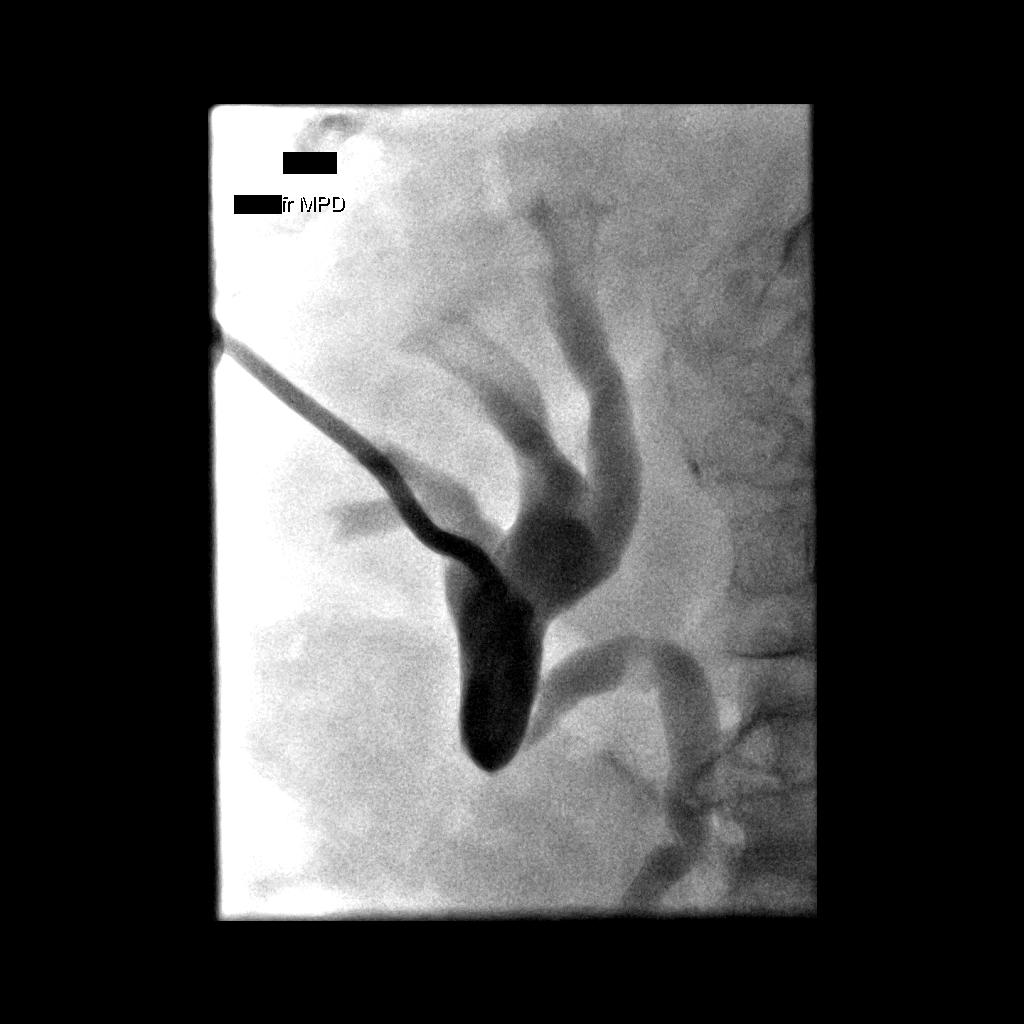
[im 2/5]
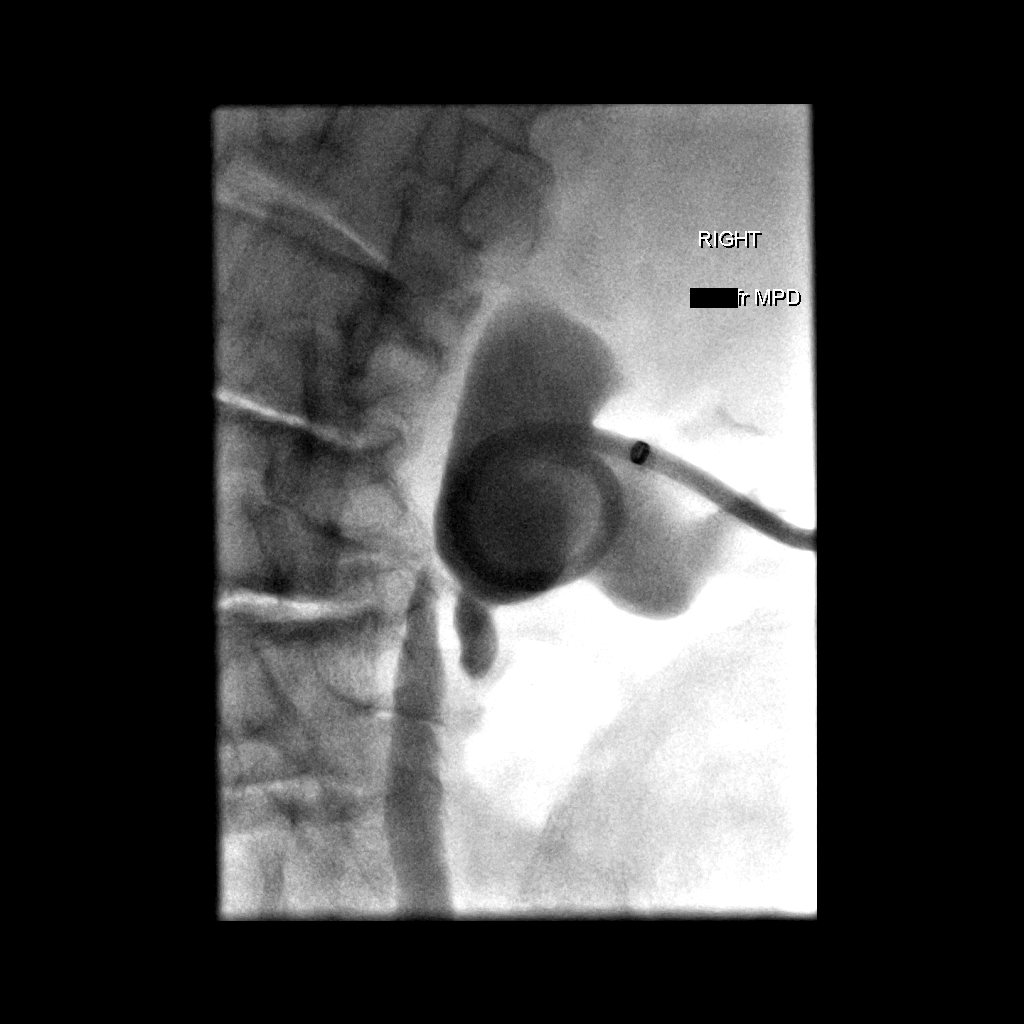
[im 3/5]
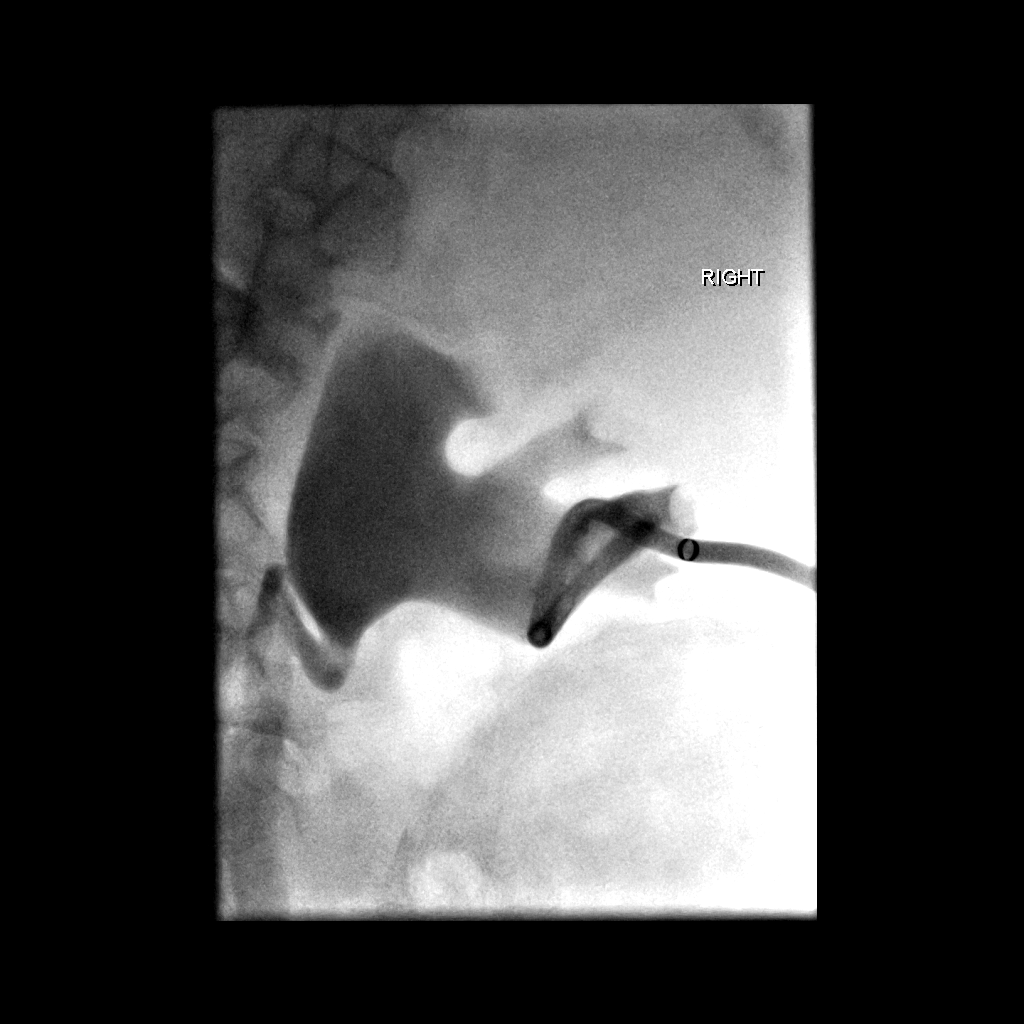
[im 4/5]
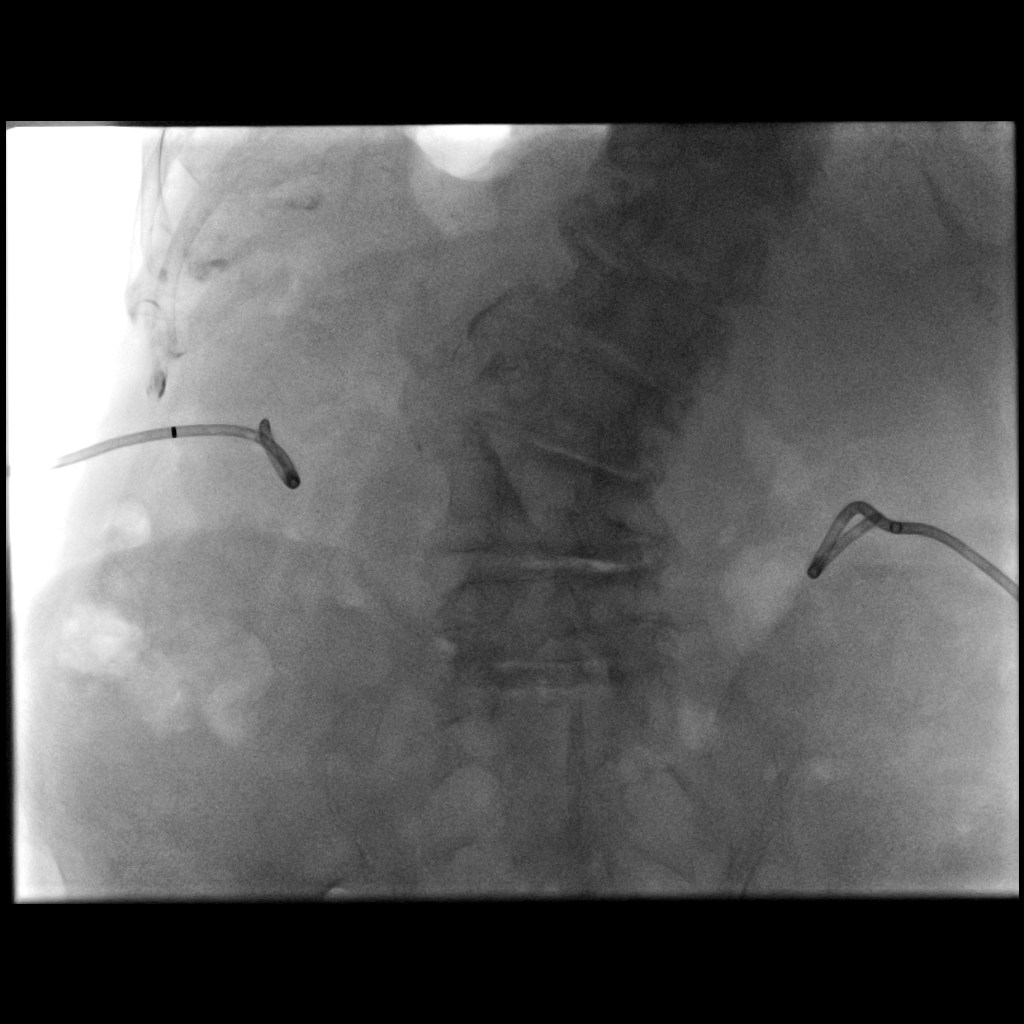
[im 5/5]
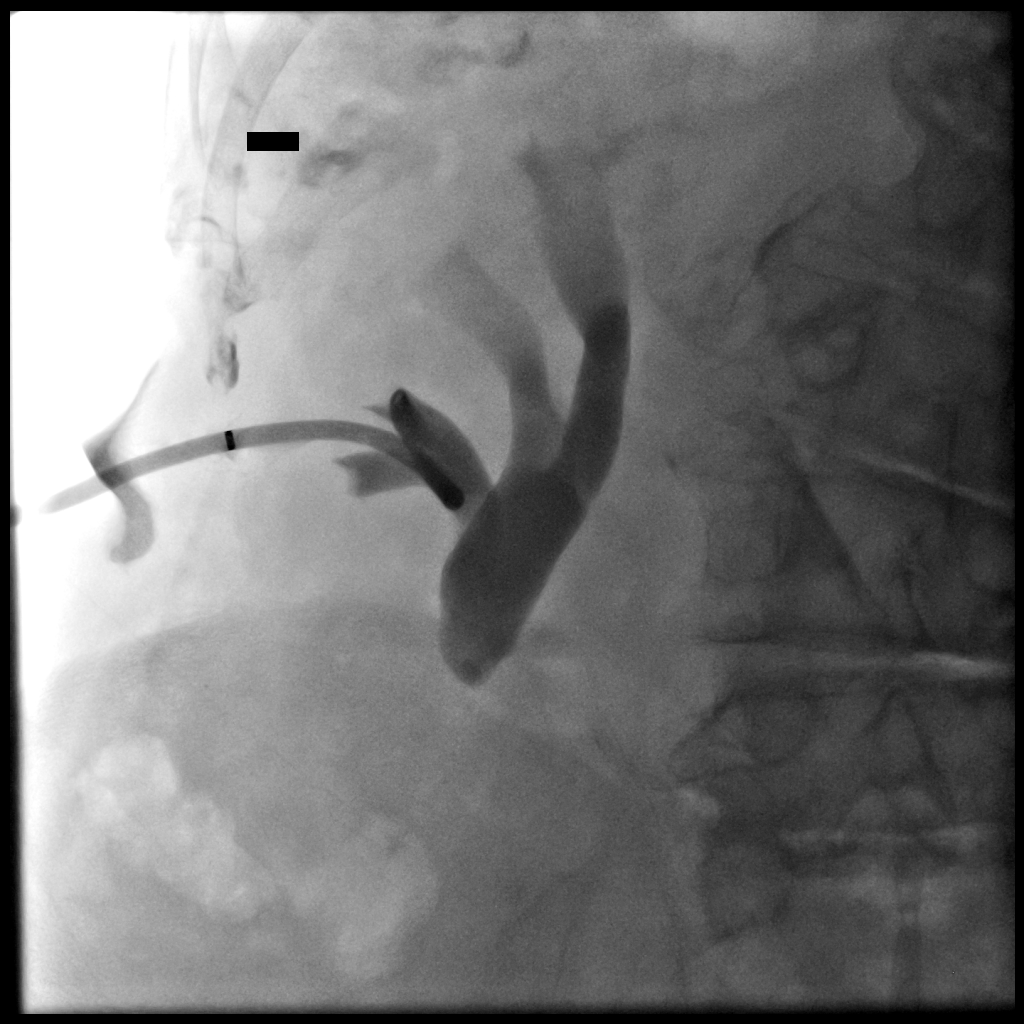

[5 of 5 positions shown; findings below may reference images not displayed]

CONTRAST:  A total of 20 mL 5sovue-GPP administered was administered
into both collecting systems

FLUOROSCOPY TIME:  1 minute 6 seconds

COMPLICATIONS:
None immediate
The bilateral flanks and external portions of existing nephrostomy
catheters were prepped and draped in the usual sterile fashion. A
sterile drape was applied covering the operative field. Maximum
barrier sterile technique with sterile gowns and gloves were used
for the procedure. A timeout was performed prior to the initiation
of the procedure.

A pre procedural spot fluoroscopic image was obtained. Beginning
with the left-sided nephrostomy, a small amount of contrast was
injected via the existing left-sided nephrostomy catheter
demonstrating appropriate positioning within the renal pelvis. The
existing nephrostomy catheter was cut and cannulated with a Benson
wire which was coiled within the renal pelvis. Under intermittent
fluoroscopic guidance, the existing nephrostomy catheter was
exchanged for a new 10.2 French all-purpose drainage catheter.
Limited contrast injection confirmed appropriate positioning within
the left renal pelvis and a post exchange fluoroscopic image was
obtained. The catheter was locked, secured to the skin with an
interrupted suture and reconnected to a gravity bag.

The identical repeat procedure was repeated for the contralateral
right-sided nephrostomy, ultimately allowing successful exchange of
a new 10.2 French all-purpose drainage catheter with end coiled and
locked within the right renal pelvis.

Dressings were placed. The patient tolerated the above procedures
well without immediate postprocedural complication.
FINDINGS: The existing nephrostomy catheters are appropriately positioned and
functioning.

After successful fluoroscopic guided exchange, new bilateral
French nephrostomy catheters are coiled and locked within the
respective renal pelvises.
IMPRESSION: Successful fluoroscopic guided exchange of bilateral 10.2 French
percutaneous nephrostomy catheters.

## 2015-12-09 IMAGING — XA IR EXCHANGE NEPHROSTOMY RIGHT
1 series · 6 of 6 positions shown · non-contrast
Comparison: Bilateral fluoroscopic guided percutaneous nephrostomy
catheter exchange - 05/25/2015;

INDICATION: Routine exchange

EXAM:
FLUOROSCOPIC GUIDED BILATERAL SIDED NEPHROSTOMY CATHETER EXCHANGE
TECHNIQUE: Informed written consent was obtained from the patient after a
discussion of the risks, benefits and alternatives to treatment.
Questions regarding the procedure were encouraged and answered. A
timeout was performed prior to the initiation of the procedure.

[Series 300: ir nephrostomy exchange right · 6 of 6 slices shown]
[im 1/6]
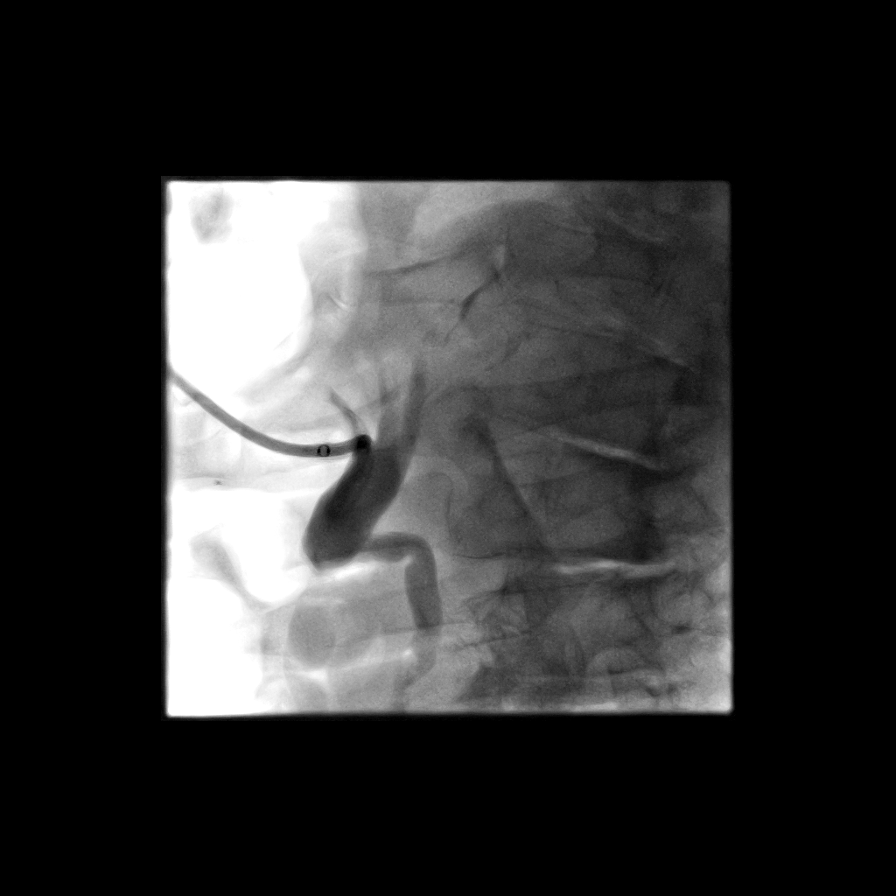
[im 2/6]
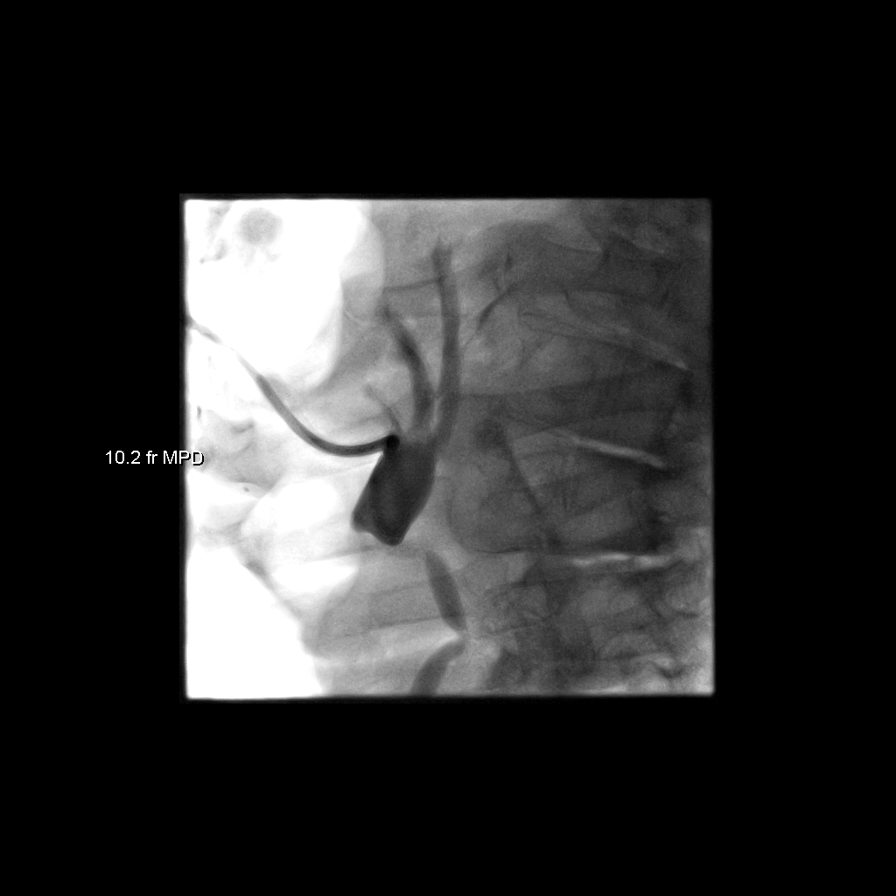
[im 3/6]
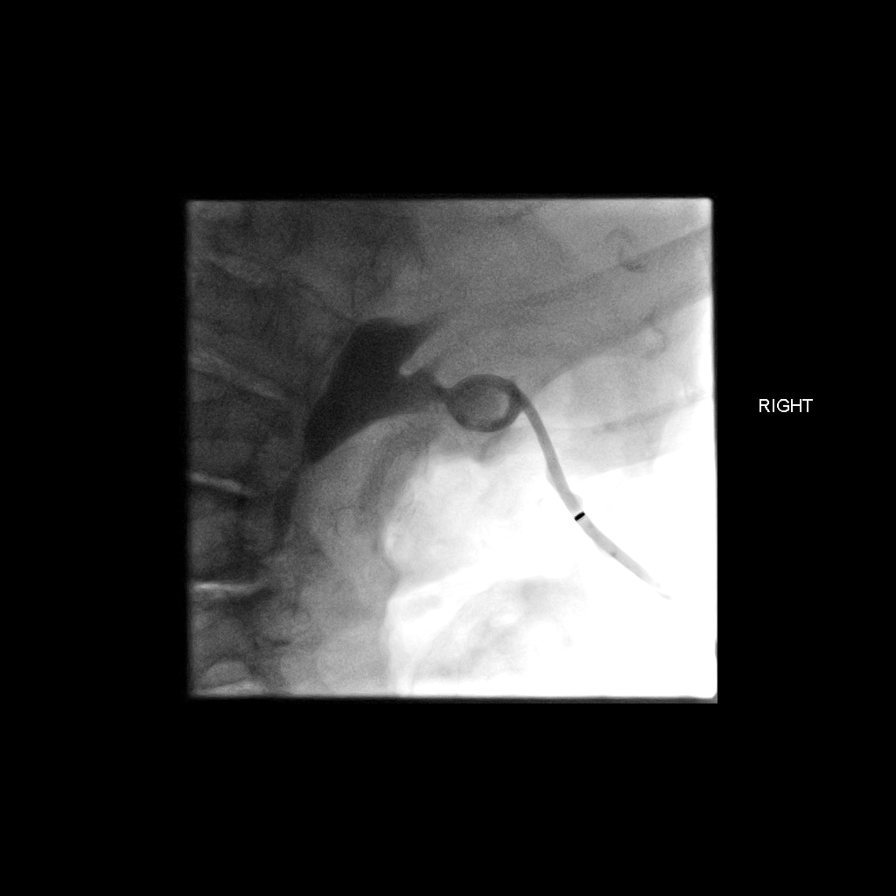
[im 4/6]
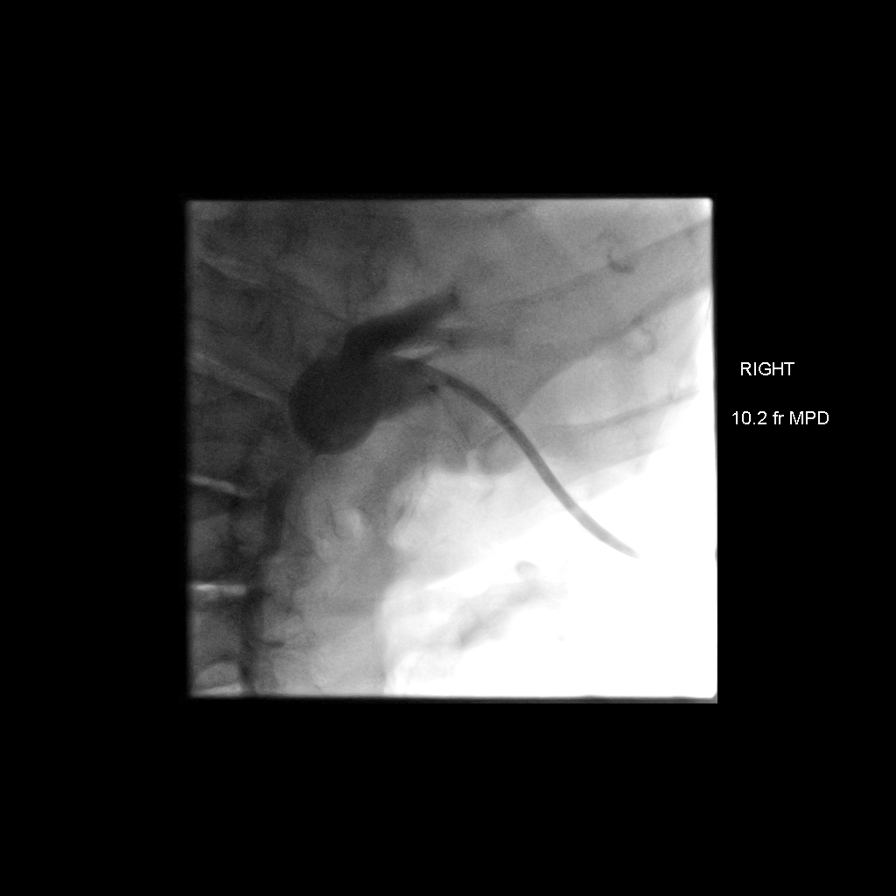
[im 5/6]
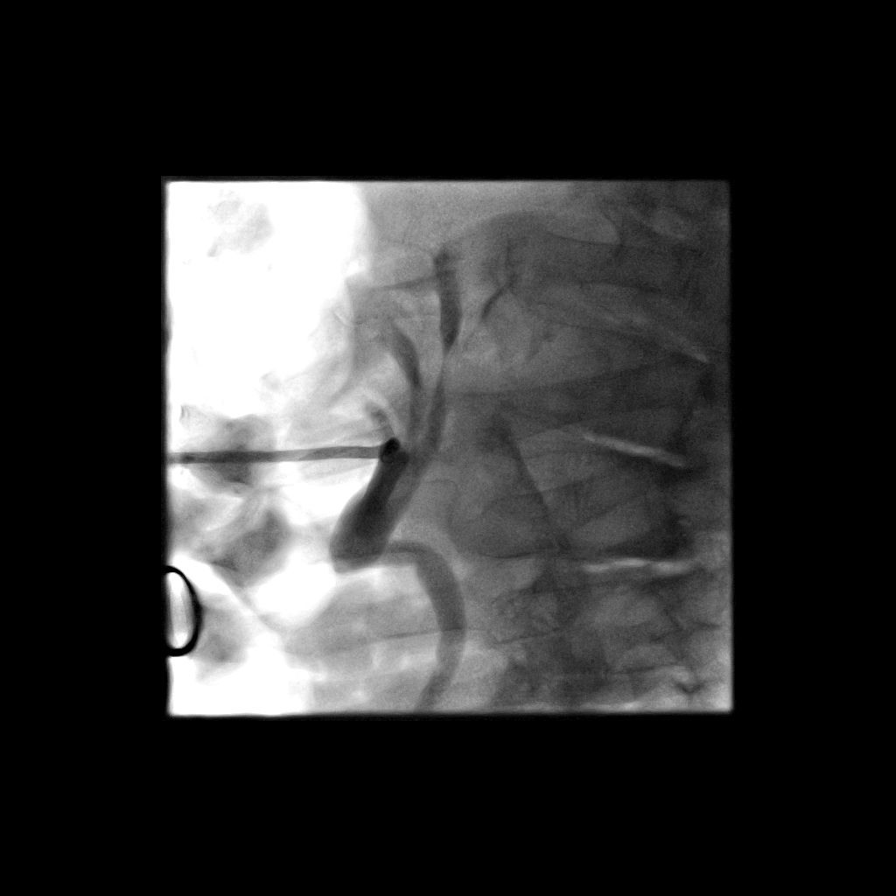
[im 6/6]
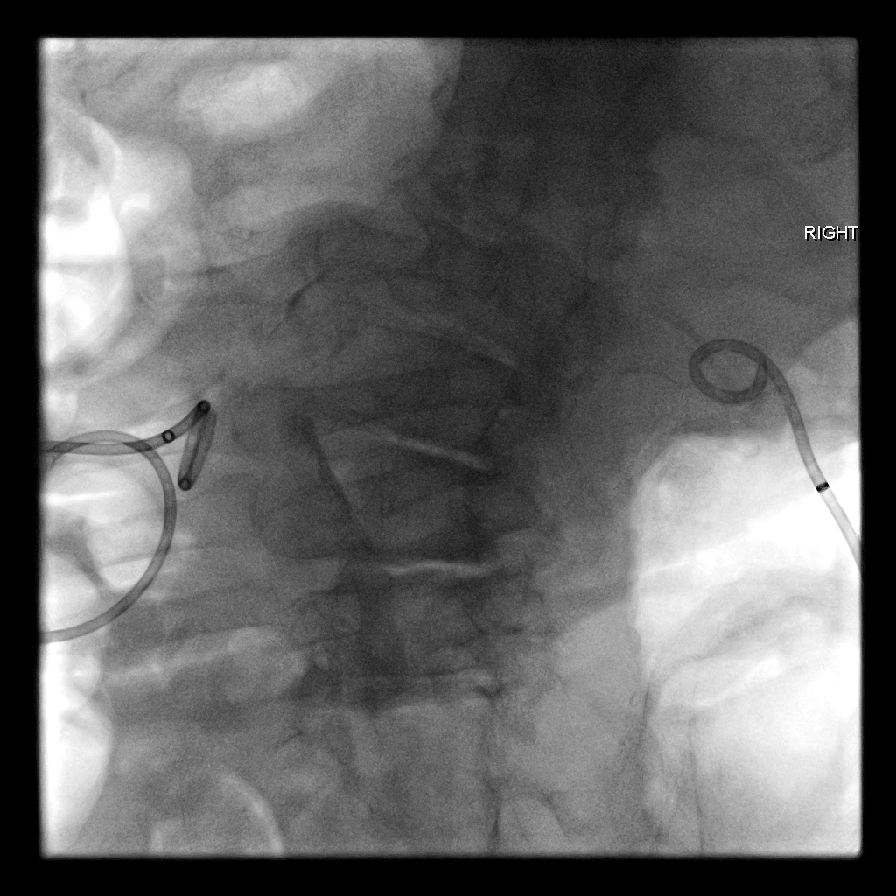

[6 of 6 positions shown; findings below may reference images not displayed]

04/12/2015

CONTRAST:  A total of 10 mL Fsovue-P00 administered was administered
into both collecting systems

FLUOROSCOPY TIME:  48 seconds (7 mGy)

COMPLICATIONS:
None immediate
The bilateral flanks and external portions of existing nephrostomy
catheters were prepped and draped in the usual sterile fashion. A
sterile drape was applied covering the operative field. Maximum
barrier sterile technique with sterile gowns and gloves were used
for the procedure. A timeout was performed prior to the initiation
of the procedure.

A pre procedural spot fluoroscopic image was obtained. Beginning
with the left-sided nephrostomy, a small amount of contrast was
injected via the existing left-sided nephrostomy catheter
demonstrating appropriate positioning within the renal pelvis. The
existing nephrostomy catheter was cut and cannulated with a Benson
wire which was coiled within the renal pelvis. Under intermittent
fluoroscopic guidance, the existing nephrostomy catheter was
exchanged for a new 10.2 French all-purpose drainage catheter.
Limited contrast injection confirmed appropriate positioning within
the left renal pelvis and a post exchange fluoroscopic image was
obtained. The catheter was locked, secured to the skin with an
interrupted suture and reconnected to a gravity bag.

The identical repeat procedure was repeated for the contralateral
right-sided nephrostomy, ultimately allowing successful exchange of
a new 10.2 French all-purpose drainage catheter with end coiled and
locked within the right renal pelvis.

Dressings were placed. The patient tolerated the above procedures
well without immediate postprocedural complication.
FINDINGS: The existing nephrostomy catheters are appropriately positioned and
functioning.

After successful fluoroscopic guided exchange, new bilateral
French nephrostomy catheters are coiled and locked within the
respective renal pelvises.
IMPRESSION: Successful fluoroscopic guided exchange of bilateral 10.2 French
percutaneous nephrostomy catheters.

PLAN:
Given the chronicity of the patient's bilateral percutaneous
nephrostomy catheters, would recommend up sizing the bilateral
nephrostomy catheters to 12 French and no longer placing a retention
suture at the nephrostomy catheter exit site.
